# Patient Record
Sex: Female | Born: 1940 | Race: White | Hispanic: No | Smoking: Current every day smoker
Health system: Southern US, Community
[De-identification: ages and names within clinical notes are randomized; demographics above are authoritative.]

## PROBLEM LIST (undated history)

## (undated) DIAGNOSIS — J449 Chronic obstructive pulmonary disease, unspecified: Secondary | ICD-10-CM

## (undated) DIAGNOSIS — I10 Essential (primary) hypertension: Secondary | ICD-10-CM

## (undated) DIAGNOSIS — F419 Anxiety disorder, unspecified: Secondary | ICD-10-CM

## (undated) DIAGNOSIS — R7303 Prediabetes: Secondary | ICD-10-CM

## (undated) DIAGNOSIS — N200 Calculus of kidney: Secondary | ICD-10-CM

## (undated) HISTORY — PX: HERNIA REPAIR: SHX51

## (undated) HISTORY — PX: EYE SURGERY: SHX253

---

## 2011-07-10 ENCOUNTER — Other Ambulatory Visit (HOSPITAL_COMMUNITY): Payer: Self-pay | Admitting: Orthopaedic Surgery

## 2011-07-10 DIAGNOSIS — M25552 Pain in left hip: Secondary | ICD-10-CM

## 2011-07-10 DIAGNOSIS — M545 Low back pain: Secondary | ICD-10-CM

## 2011-07-22 ENCOUNTER — Ambulatory Visit (HOSPITAL_COMMUNITY): Payer: Self-pay

## 2011-07-22 ENCOUNTER — Encounter (HOSPITAL_COMMUNITY): Payer: Self-pay

## 2014-04-25 DIAGNOSIS — M8580 Other specified disorders of bone density and structure, unspecified site: Secondary | ICD-10-CM | POA: Diagnosis not present

## 2014-04-25 DIAGNOSIS — E785 Hyperlipidemia, unspecified: Secondary | ICD-10-CM | POA: Diagnosis not present

## 2014-04-25 DIAGNOSIS — Z23 Encounter for immunization: Secondary | ICD-10-CM | POA: Diagnosis not present

## 2014-04-25 DIAGNOSIS — J449 Chronic obstructive pulmonary disease, unspecified: Secondary | ICD-10-CM | POA: Diagnosis not present

## 2014-04-25 DIAGNOSIS — I10 Essential (primary) hypertension: Secondary | ICD-10-CM | POA: Diagnosis not present

## 2014-04-25 DIAGNOSIS — E1140 Type 2 diabetes mellitus with diabetic neuropathy, unspecified: Secondary | ICD-10-CM | POA: Diagnosis not present

## 2014-05-02 DIAGNOSIS — M8580 Other specified disorders of bone density and structure, unspecified site: Secondary | ICD-10-CM | POA: Diagnosis not present

## 2014-05-02 DIAGNOSIS — Z13820 Encounter for screening for osteoporosis: Secondary | ICD-10-CM | POA: Diagnosis not present

## 2014-05-02 DIAGNOSIS — M8589 Other specified disorders of bone density and structure, multiple sites: Secondary | ICD-10-CM | POA: Diagnosis not present

## 2014-05-02 DIAGNOSIS — Z1231 Encounter for screening mammogram for malignant neoplasm of breast: Secondary | ICD-10-CM | POA: Diagnosis not present

## 2014-05-08 DIAGNOSIS — J449 Chronic obstructive pulmonary disease, unspecified: Secondary | ICD-10-CM | POA: Diagnosis not present

## 2014-05-09 DIAGNOSIS — N6002 Solitary cyst of left breast: Secondary | ICD-10-CM | POA: Diagnosis not present

## 2014-05-09 DIAGNOSIS — R928 Other abnormal and inconclusive findings on diagnostic imaging of breast: Secondary | ICD-10-CM | POA: Diagnosis not present

## 2014-06-08 DIAGNOSIS — J449 Chronic obstructive pulmonary disease, unspecified: Secondary | ICD-10-CM | POA: Diagnosis not present

## 2014-06-28 DIAGNOSIS — L578 Other skin changes due to chronic exposure to nonionizing radiation: Secondary | ICD-10-CM | POA: Diagnosis not present

## 2014-06-28 DIAGNOSIS — L821 Other seborrheic keratosis: Secondary | ICD-10-CM | POA: Diagnosis not present

## 2014-06-28 DIAGNOSIS — C44529 Squamous cell carcinoma of skin of other part of trunk: Secondary | ICD-10-CM | POA: Diagnosis not present

## 2014-06-28 DIAGNOSIS — H25813 Combined forms of age-related cataract, bilateral: Secondary | ICD-10-CM | POA: Diagnosis not present

## 2014-07-05 DIAGNOSIS — C44529 Squamous cell carcinoma of skin of other part of trunk: Secondary | ICD-10-CM | POA: Diagnosis not present

## 2014-07-07 DIAGNOSIS — J449 Chronic obstructive pulmonary disease, unspecified: Secondary | ICD-10-CM | POA: Diagnosis not present

## 2014-07-27 DIAGNOSIS — Z1389 Encounter for screening for other disorder: Secondary | ICD-10-CM | POA: Diagnosis not present

## 2014-07-27 DIAGNOSIS — E785 Hyperlipidemia, unspecified: Secondary | ICD-10-CM | POA: Diagnosis not present

## 2014-07-27 DIAGNOSIS — Z9181 History of falling: Secondary | ICD-10-CM | POA: Diagnosis not present

## 2014-07-27 DIAGNOSIS — J449 Chronic obstructive pulmonary disease, unspecified: Secondary | ICD-10-CM | POA: Diagnosis not present

## 2014-07-27 DIAGNOSIS — E559 Vitamin D deficiency, unspecified: Secondary | ICD-10-CM | POA: Diagnosis not present

## 2014-07-27 DIAGNOSIS — I10 Essential (primary) hypertension: Secondary | ICD-10-CM | POA: Diagnosis not present

## 2014-07-27 DIAGNOSIS — E1142 Type 2 diabetes mellitus with diabetic polyneuropathy: Secondary | ICD-10-CM | POA: Diagnosis not present

## 2014-07-27 DIAGNOSIS — J302 Other seasonal allergic rhinitis: Secondary | ICD-10-CM | POA: Diagnosis not present

## 2014-08-07 DIAGNOSIS — J449 Chronic obstructive pulmonary disease, unspecified: Secondary | ICD-10-CM | POA: Diagnosis not present

## 2014-08-11 DIAGNOSIS — Z6832 Body mass index (BMI) 32.0-32.9, adult: Secondary | ICD-10-CM | POA: Diagnosis not present

## 2014-08-11 DIAGNOSIS — M1611 Unilateral primary osteoarthritis, right hip: Secondary | ICD-10-CM | POA: Diagnosis not present

## 2014-08-11 DIAGNOSIS — F419 Anxiety disorder, unspecified: Secondary | ICD-10-CM | POA: Diagnosis not present

## 2014-08-11 DIAGNOSIS — M5126 Other intervertebral disc displacement, lumbar region: Secondary | ICD-10-CM | POA: Diagnosis not present

## 2014-08-12 DIAGNOSIS — R269 Unspecified abnormalities of gait and mobility: Secondary | ICD-10-CM | POA: Diagnosis not present

## 2014-08-12 DIAGNOSIS — F17210 Nicotine dependence, cigarettes, uncomplicated: Secondary | ICD-10-CM | POA: Diagnosis not present

## 2014-08-12 DIAGNOSIS — Z79899 Other long term (current) drug therapy: Secondary | ICD-10-CM | POA: Diagnosis not present

## 2014-08-12 DIAGNOSIS — R51 Headache: Secondary | ICD-10-CM | POA: Diagnosis not present

## 2014-08-12 DIAGNOSIS — Z7952 Long term (current) use of systemic steroids: Secondary | ICD-10-CM | POA: Diagnosis not present

## 2014-08-12 DIAGNOSIS — M25551 Pain in right hip: Secondary | ICD-10-CM | POA: Diagnosis not present

## 2014-08-12 DIAGNOSIS — M79662 Pain in left lower leg: Secondary | ICD-10-CM | POA: Diagnosis not present

## 2014-08-12 DIAGNOSIS — M545 Low back pain: Secondary | ICD-10-CM | POA: Diagnosis not present

## 2014-08-12 DIAGNOSIS — S199XXA Unspecified injury of neck, initial encounter: Secondary | ICD-10-CM | POA: Diagnosis not present

## 2014-08-12 DIAGNOSIS — R404 Transient alteration of awareness: Secondary | ICD-10-CM | POA: Diagnosis not present

## 2014-08-12 DIAGNOSIS — Z9181 History of falling: Secondary | ICD-10-CM | POA: Diagnosis not present

## 2014-08-12 DIAGNOSIS — M542 Cervicalgia: Secondary | ICD-10-CM | POA: Diagnosis not present

## 2014-08-12 DIAGNOSIS — S299XXA Unspecified injury of thorax, initial encounter: Secondary | ICD-10-CM | POA: Diagnosis not present

## 2014-08-12 DIAGNOSIS — R102 Pelvic and perineal pain: Secondary | ICD-10-CM | POA: Diagnosis not present

## 2014-08-12 DIAGNOSIS — M79661 Pain in right lower leg: Secondary | ICD-10-CM | POA: Diagnosis not present

## 2014-08-12 DIAGNOSIS — M1611 Unilateral primary osteoarthritis, right hip: Secondary | ICD-10-CM | POA: Diagnosis not present

## 2014-08-12 DIAGNOSIS — M79605 Pain in left leg: Secondary | ICD-10-CM | POA: Diagnosis not present

## 2014-08-12 DIAGNOSIS — R531 Weakness: Secondary | ICD-10-CM | POA: Diagnosis not present

## 2014-08-12 DIAGNOSIS — M79604 Pain in right leg: Secondary | ICD-10-CM | POA: Diagnosis not present

## 2014-08-12 DIAGNOSIS — I10 Essential (primary) hypertension: Secondary | ICD-10-CM | POA: Diagnosis not present

## 2014-08-12 DIAGNOSIS — M546 Pain in thoracic spine: Secondary | ICD-10-CM | POA: Diagnosis not present

## 2014-08-14 DIAGNOSIS — E119 Type 2 diabetes mellitus without complications: Secondary | ICD-10-CM | POA: Diagnosis not present

## 2014-08-14 DIAGNOSIS — M545 Low back pain: Secondary | ICD-10-CM | POA: Diagnosis not present

## 2014-08-14 DIAGNOSIS — M6281 Muscle weakness (generalized): Secondary | ICD-10-CM | POA: Diagnosis not present

## 2014-08-14 DIAGNOSIS — R296 Repeated falls: Secondary | ICD-10-CM | POA: Diagnosis not present

## 2014-08-16 DIAGNOSIS — M545 Low back pain: Secondary | ICD-10-CM | POA: Diagnosis not present

## 2014-08-16 DIAGNOSIS — M6281 Muscle weakness (generalized): Secondary | ICD-10-CM | POA: Diagnosis not present

## 2014-08-16 DIAGNOSIS — E119 Type 2 diabetes mellitus without complications: Secondary | ICD-10-CM | POA: Diagnosis not present

## 2014-08-16 DIAGNOSIS — R296 Repeated falls: Secondary | ICD-10-CM | POA: Diagnosis not present

## 2014-08-21 DIAGNOSIS — M545 Low back pain: Secondary | ICD-10-CM | POA: Diagnosis not present

## 2014-08-21 DIAGNOSIS — M6281 Muscle weakness (generalized): Secondary | ICD-10-CM | POA: Diagnosis not present

## 2014-08-21 DIAGNOSIS — R296 Repeated falls: Secondary | ICD-10-CM | POA: Diagnosis not present

## 2014-08-21 DIAGNOSIS — E119 Type 2 diabetes mellitus without complications: Secondary | ICD-10-CM | POA: Diagnosis not present

## 2014-08-22 DIAGNOSIS — E119 Type 2 diabetes mellitus without complications: Secondary | ICD-10-CM | POA: Diagnosis not present

## 2014-08-22 DIAGNOSIS — R296 Repeated falls: Secondary | ICD-10-CM | POA: Diagnosis not present

## 2014-08-22 DIAGNOSIS — M545 Low back pain: Secondary | ICD-10-CM | POA: Diagnosis not present

## 2014-08-22 DIAGNOSIS — M6281 Muscle weakness (generalized): Secondary | ICD-10-CM | POA: Diagnosis not present

## 2014-08-23 DIAGNOSIS — M545 Low back pain: Secondary | ICD-10-CM | POA: Diagnosis not present

## 2014-08-24 DIAGNOSIS — M6281 Muscle weakness (generalized): Secondary | ICD-10-CM | POA: Diagnosis not present

## 2014-08-24 DIAGNOSIS — R296 Repeated falls: Secondary | ICD-10-CM | POA: Diagnosis not present

## 2014-08-24 DIAGNOSIS — M545 Low back pain: Secondary | ICD-10-CM | POA: Diagnosis not present

## 2014-08-24 DIAGNOSIS — E119 Type 2 diabetes mellitus without complications: Secondary | ICD-10-CM | POA: Diagnosis not present

## 2014-08-26 DIAGNOSIS — R296 Repeated falls: Secondary | ICD-10-CM | POA: Diagnosis not present

## 2014-08-26 DIAGNOSIS — M545 Low back pain: Secondary | ICD-10-CM | POA: Diagnosis not present

## 2014-08-26 DIAGNOSIS — E119 Type 2 diabetes mellitus without complications: Secondary | ICD-10-CM | POA: Diagnosis not present

## 2014-08-26 DIAGNOSIS — M6281 Muscle weakness (generalized): Secondary | ICD-10-CM | POA: Diagnosis not present

## 2014-08-29 DIAGNOSIS — M545 Low back pain: Secondary | ICD-10-CM | POA: Diagnosis not present

## 2014-08-29 DIAGNOSIS — R296 Repeated falls: Secondary | ICD-10-CM | POA: Diagnosis not present

## 2014-08-29 DIAGNOSIS — M6281 Muscle weakness (generalized): Secondary | ICD-10-CM | POA: Diagnosis not present

## 2014-08-29 DIAGNOSIS — E119 Type 2 diabetes mellitus without complications: Secondary | ICD-10-CM | POA: Diagnosis not present

## 2014-09-01 DIAGNOSIS — M545 Low back pain: Secondary | ICD-10-CM | POA: Diagnosis not present

## 2014-09-01 DIAGNOSIS — R296 Repeated falls: Secondary | ICD-10-CM | POA: Diagnosis not present

## 2014-09-01 DIAGNOSIS — M6281 Muscle weakness (generalized): Secondary | ICD-10-CM | POA: Diagnosis not present

## 2014-09-01 DIAGNOSIS — E119 Type 2 diabetes mellitus without complications: Secondary | ICD-10-CM | POA: Diagnosis not present

## 2014-09-06 DIAGNOSIS — J449 Chronic obstructive pulmonary disease, unspecified: Secondary | ICD-10-CM | POA: Diagnosis not present

## 2014-09-07 DIAGNOSIS — E119 Type 2 diabetes mellitus without complications: Secondary | ICD-10-CM | POA: Diagnosis not present

## 2014-09-07 DIAGNOSIS — M6281 Muscle weakness (generalized): Secondary | ICD-10-CM | POA: Diagnosis not present

## 2014-09-07 DIAGNOSIS — R296 Repeated falls: Secondary | ICD-10-CM | POA: Diagnosis not present

## 2014-09-07 DIAGNOSIS — M545 Low back pain: Secondary | ICD-10-CM | POA: Diagnosis not present

## 2014-09-08 DIAGNOSIS — M545 Low back pain: Secondary | ICD-10-CM | POA: Diagnosis not present

## 2014-09-08 DIAGNOSIS — R296 Repeated falls: Secondary | ICD-10-CM | POA: Diagnosis not present

## 2014-09-08 DIAGNOSIS — M6281 Muscle weakness (generalized): Secondary | ICD-10-CM | POA: Diagnosis not present

## 2014-09-08 DIAGNOSIS — E119 Type 2 diabetes mellitus without complications: Secondary | ICD-10-CM | POA: Diagnosis not present

## 2014-09-20 DIAGNOSIS — M6281 Muscle weakness (generalized): Secondary | ICD-10-CM | POA: Diagnosis not present

## 2014-09-29 DIAGNOSIS — R531 Weakness: Secondary | ICD-10-CM | POA: Diagnosis not present

## 2014-09-30 DIAGNOSIS — G6181 Chronic inflammatory demyelinating polyneuritis: Secondary | ICD-10-CM | POA: Diagnosis not present

## 2014-09-30 DIAGNOSIS — M5417 Radiculopathy, lumbosacral region: Secondary | ICD-10-CM | POA: Diagnosis not present

## 2014-09-30 DIAGNOSIS — M6281 Muscle weakness (generalized): Secondary | ICD-10-CM | POA: Diagnosis not present

## 2014-09-30 DIAGNOSIS — G5731 Lesion of lateral popliteal nerve, right lower limb: Secondary | ICD-10-CM | POA: Diagnosis not present

## 2014-10-05 DIAGNOSIS — M5126 Other intervertebral disc displacement, lumbar region: Secondary | ICD-10-CM | POA: Diagnosis not present

## 2014-10-05 DIAGNOSIS — M47812 Spondylosis without myelopathy or radiculopathy, cervical region: Secondary | ICD-10-CM | POA: Diagnosis not present

## 2014-10-05 DIAGNOSIS — M4316 Spondylolisthesis, lumbar region: Secondary | ICD-10-CM | POA: Diagnosis not present

## 2014-10-05 DIAGNOSIS — G6181 Chronic inflammatory demyelinating polyneuritis: Secondary | ICD-10-CM | POA: Diagnosis not present

## 2014-10-05 DIAGNOSIS — M5136 Other intervertebral disc degeneration, lumbar region: Secondary | ICD-10-CM | POA: Diagnosis not present

## 2014-10-05 DIAGNOSIS — M9973 Connective tissue and disc stenosis of intervertebral foramina of lumbar region: Secondary | ICD-10-CM | POA: Diagnosis not present

## 2014-10-07 DIAGNOSIS — J449 Chronic obstructive pulmonary disease, unspecified: Secondary | ICD-10-CM | POA: Diagnosis not present

## 2014-10-10 DIAGNOSIS — N201 Calculus of ureter: Secondary | ICD-10-CM | POA: Diagnosis not present

## 2014-10-10 DIAGNOSIS — N3090 Cystitis, unspecified without hematuria: Secondary | ICD-10-CM | POA: Diagnosis not present

## 2014-10-10 DIAGNOSIS — N1330 Unspecified hydronephrosis: Secondary | ICD-10-CM | POA: Diagnosis not present

## 2014-10-13 DIAGNOSIS — I10 Essential (primary) hypertension: Secondary | ICD-10-CM | POA: Diagnosis not present

## 2014-10-13 DIAGNOSIS — E119 Type 2 diabetes mellitus without complications: Secondary | ICD-10-CM | POA: Diagnosis not present

## 2014-10-13 DIAGNOSIS — N132 Hydronephrosis with renal and ureteral calculous obstruction: Secondary | ICD-10-CM | POA: Diagnosis not present

## 2014-10-13 DIAGNOSIS — F17200 Nicotine dependence, unspecified, uncomplicated: Secondary | ICD-10-CM | POA: Diagnosis not present

## 2014-10-13 DIAGNOSIS — J449 Chronic obstructive pulmonary disease, unspecified: Secondary | ICD-10-CM | POA: Diagnosis not present

## 2014-10-13 DIAGNOSIS — N209 Urinary calculus, unspecified: Secondary | ICD-10-CM | POA: Diagnosis not present

## 2014-10-13 DIAGNOSIS — N201 Calculus of ureter: Secondary | ICD-10-CM | POA: Diagnosis not present

## 2014-10-20 DIAGNOSIS — G6181 Chronic inflammatory demyelinating polyneuritis: Secondary | ICD-10-CM | POA: Diagnosis not present

## 2014-10-20 DIAGNOSIS — N201 Calculus of ureter: Secondary | ICD-10-CM | POA: Diagnosis not present

## 2014-10-20 DIAGNOSIS — N1330 Unspecified hydronephrosis: Secondary | ICD-10-CM | POA: Diagnosis not present

## 2014-10-20 DIAGNOSIS — M5417 Radiculopathy, lumbosacral region: Secondary | ICD-10-CM | POA: Diagnosis not present

## 2014-10-20 DIAGNOSIS — G5731 Lesion of lateral popliteal nerve, right lower limb: Secondary | ICD-10-CM | POA: Diagnosis not present

## 2014-10-20 DIAGNOSIS — M6281 Muscle weakness (generalized): Secondary | ICD-10-CM | POA: Diagnosis not present

## 2014-10-20 DIAGNOSIS — N3090 Cystitis, unspecified without hematuria: Secondary | ICD-10-CM | POA: Diagnosis not present

## 2014-10-27 DIAGNOSIS — M6281 Muscle weakness (generalized): Secondary | ICD-10-CM | POA: Diagnosis not present

## 2014-10-27 DIAGNOSIS — R836 Abnormal cytological findings in cerebrospinal fluid: Secondary | ICD-10-CM | POA: Diagnosis not present

## 2014-10-27 DIAGNOSIS — G6181 Chronic inflammatory demyelinating polyneuritis: Secondary | ICD-10-CM | POA: Diagnosis not present

## 2014-11-06 DIAGNOSIS — J449 Chronic obstructive pulmonary disease, unspecified: Secondary | ICD-10-CM | POA: Diagnosis not present

## 2014-11-08 DIAGNOSIS — N3090 Cystitis, unspecified without hematuria: Secondary | ICD-10-CM | POA: Diagnosis not present

## 2014-11-08 DIAGNOSIS — N201 Calculus of ureter: Secondary | ICD-10-CM | POA: Diagnosis not present

## 2014-11-08 DIAGNOSIS — N1330 Unspecified hydronephrosis: Secondary | ICD-10-CM | POA: Diagnosis not present

## 2014-11-15 DIAGNOSIS — C44529 Squamous cell carcinoma of skin of other part of trunk: Secondary | ICD-10-CM | POA: Diagnosis not present

## 2014-11-15 DIAGNOSIS — L821 Other seborrheic keratosis: Secondary | ICD-10-CM | POA: Diagnosis not present

## 2014-11-15 DIAGNOSIS — C44729 Squamous cell carcinoma of skin of left lower limb, including hip: Secondary | ICD-10-CM | POA: Diagnosis not present

## 2014-11-21 DIAGNOSIS — M6281 Muscle weakness (generalized): Secondary | ICD-10-CM | POA: Diagnosis not present

## 2014-11-21 DIAGNOSIS — M5417 Radiculopathy, lumbosacral region: Secondary | ICD-10-CM | POA: Diagnosis not present

## 2014-11-21 DIAGNOSIS — G3184 Mild cognitive impairment, so stated: Secondary | ICD-10-CM | POA: Diagnosis not present

## 2014-12-01 DIAGNOSIS — M4727 Other spondylosis with radiculopathy, lumbosacral region: Secondary | ICD-10-CM | POA: Diagnosis not present

## 2014-12-01 DIAGNOSIS — M5417 Radiculopathy, lumbosacral region: Secondary | ICD-10-CM | POA: Diagnosis not present

## 2014-12-01 DIAGNOSIS — M6281 Muscle weakness (generalized): Secondary | ICD-10-CM | POA: Diagnosis not present

## 2014-12-01 DIAGNOSIS — M545 Low back pain: Secondary | ICD-10-CM | POA: Diagnosis not present

## 2014-12-07 DIAGNOSIS — J449 Chronic obstructive pulmonary disease, unspecified: Secondary | ICD-10-CM | POA: Diagnosis not present

## 2014-12-13 DIAGNOSIS — N3090 Cystitis, unspecified without hematuria: Secondary | ICD-10-CM | POA: Diagnosis not present

## 2014-12-13 DIAGNOSIS — N1330 Unspecified hydronephrosis: Secondary | ICD-10-CM | POA: Diagnosis not present

## 2014-12-13 DIAGNOSIS — N201 Calculus of ureter: Secondary | ICD-10-CM | POA: Diagnosis not present

## 2015-01-07 DIAGNOSIS — J449 Chronic obstructive pulmonary disease, unspecified: Secondary | ICD-10-CM | POA: Diagnosis not present

## 2015-01-30 DIAGNOSIS — Z1389 Encounter for screening for other disorder: Secondary | ICD-10-CM | POA: Diagnosis not present

## 2015-01-30 DIAGNOSIS — J449 Chronic obstructive pulmonary disease, unspecified: Secondary | ICD-10-CM | POA: Diagnosis not present

## 2015-01-30 DIAGNOSIS — E1142 Type 2 diabetes mellitus with diabetic polyneuropathy: Secondary | ICD-10-CM | POA: Diagnosis not present

## 2015-01-30 DIAGNOSIS — E559 Vitamin D deficiency, unspecified: Secondary | ICD-10-CM | POA: Diagnosis not present

## 2015-01-30 DIAGNOSIS — Z23 Encounter for immunization: Secondary | ICD-10-CM | POA: Diagnosis not present

## 2015-01-30 DIAGNOSIS — I10 Essential (primary) hypertension: Secondary | ICD-10-CM | POA: Diagnosis not present

## 2015-01-30 DIAGNOSIS — E785 Hyperlipidemia, unspecified: Secondary | ICD-10-CM | POA: Diagnosis not present

## 2015-01-30 DIAGNOSIS — Z139 Encounter for screening, unspecified: Secondary | ICD-10-CM | POA: Diagnosis not present

## 2015-01-30 DIAGNOSIS — Z9181 History of falling: Secondary | ICD-10-CM | POA: Diagnosis not present

## 2015-01-30 DIAGNOSIS — F0390 Unspecified dementia without behavioral disturbance: Secondary | ICD-10-CM | POA: Diagnosis not present

## 2015-02-06 DIAGNOSIS — J449 Chronic obstructive pulmonary disease, unspecified: Secondary | ICD-10-CM | POA: Diagnosis not present

## 2015-02-28 DIAGNOSIS — B356 Tinea cruris: Secondary | ICD-10-CM | POA: Diagnosis not present

## 2015-02-28 DIAGNOSIS — J440 Chronic obstructive pulmonary disease with acute lower respiratory infection: Secondary | ICD-10-CM | POA: Diagnosis not present

## 2015-02-28 DIAGNOSIS — F419 Anxiety disorder, unspecified: Secondary | ICD-10-CM | POA: Diagnosis not present

## 2015-03-08 DIAGNOSIS — H43393 Other vitreous opacities, bilateral: Secondary | ICD-10-CM | POA: Diagnosis not present

## 2015-03-08 DIAGNOSIS — E119 Type 2 diabetes mellitus without complications: Secondary | ICD-10-CM | POA: Diagnosis not present

## 2015-03-09 DIAGNOSIS — J449 Chronic obstructive pulmonary disease, unspecified: Secondary | ICD-10-CM | POA: Diagnosis not present

## 2015-04-08 DIAGNOSIS — J449 Chronic obstructive pulmonary disease, unspecified: Secondary | ICD-10-CM | POA: Diagnosis not present

## 2015-04-23 DIAGNOSIS — N200 Calculus of kidney: Secondary | ICD-10-CM

## 2015-04-23 HISTORY — DX: Calculus of kidney: N20.0

## 2015-05-09 DIAGNOSIS — J449 Chronic obstructive pulmonary disease, unspecified: Secondary | ICD-10-CM | POA: Diagnosis not present

## 2015-06-09 DIAGNOSIS — J449 Chronic obstructive pulmonary disease, unspecified: Secondary | ICD-10-CM | POA: Diagnosis not present

## 2015-07-07 DIAGNOSIS — J449 Chronic obstructive pulmonary disease, unspecified: Secondary | ICD-10-CM | POA: Diagnosis not present

## 2015-07-18 DIAGNOSIS — Z1231 Encounter for screening mammogram for malignant neoplasm of breast: Secondary | ICD-10-CM | POA: Diagnosis not present

## 2015-08-07 DIAGNOSIS — J449 Chronic obstructive pulmonary disease, unspecified: Secondary | ICD-10-CM | POA: Diagnosis not present

## 2015-08-14 DIAGNOSIS — H40013 Open angle with borderline findings, low risk, bilateral: Secondary | ICD-10-CM | POA: Diagnosis not present

## 2015-08-22 DIAGNOSIS — E785 Hyperlipidemia, unspecified: Secondary | ICD-10-CM | POA: Diagnosis not present

## 2015-08-22 DIAGNOSIS — R233 Spontaneous ecchymoses: Secondary | ICD-10-CM | POA: Diagnosis not present

## 2015-08-22 DIAGNOSIS — Z23 Encounter for immunization: Secondary | ICD-10-CM | POA: Diagnosis not present

## 2015-08-22 DIAGNOSIS — E1142 Type 2 diabetes mellitus with diabetic polyneuropathy: Secondary | ICD-10-CM | POA: Diagnosis not present

## 2015-08-22 DIAGNOSIS — E559 Vitamin D deficiency, unspecified: Secondary | ICD-10-CM | POA: Diagnosis not present

## 2015-08-22 DIAGNOSIS — I10 Essential (primary) hypertension: Secondary | ICD-10-CM | POA: Diagnosis not present

## 2015-08-22 DIAGNOSIS — J449 Chronic obstructive pulmonary disease, unspecified: Secondary | ICD-10-CM | POA: Diagnosis not present

## 2015-08-23 DIAGNOSIS — R413 Other amnesia: Secondary | ICD-10-CM | POA: Diagnosis not present

## 2015-08-23 DIAGNOSIS — M5417 Radiculopathy, lumbosacral region: Secondary | ICD-10-CM | POA: Diagnosis not present

## 2015-08-26 DIAGNOSIS — R413 Other amnesia: Secondary | ICD-10-CM | POA: Diagnosis not present

## 2015-09-01 DIAGNOSIS — R079 Chest pain, unspecified: Secondary | ICD-10-CM | POA: Diagnosis not present

## 2015-09-01 DIAGNOSIS — Z9981 Dependence on supplemental oxygen: Secondary | ICD-10-CM | POA: Diagnosis not present

## 2015-09-01 DIAGNOSIS — E119 Type 2 diabetes mellitus without complications: Secondary | ICD-10-CM | POA: Diagnosis not present

## 2015-09-01 DIAGNOSIS — I201 Angina pectoris with documented spasm: Secondary | ICD-10-CM | POA: Diagnosis not present

## 2015-09-01 DIAGNOSIS — I160 Hypertensive urgency: Secondary | ICD-10-CM | POA: Diagnosis not present

## 2015-09-01 DIAGNOSIS — G8929 Other chronic pain: Secondary | ICD-10-CM | POA: Diagnosis not present

## 2015-09-01 DIAGNOSIS — Z7982 Long term (current) use of aspirin: Secondary | ICD-10-CM | POA: Diagnosis not present

## 2015-09-01 DIAGNOSIS — J449 Chronic obstructive pulmonary disease, unspecified: Secondary | ICD-10-CM | POA: Diagnosis not present

## 2015-09-01 DIAGNOSIS — I209 Angina pectoris, unspecified: Secondary | ICD-10-CM | POA: Diagnosis not present

## 2015-09-01 DIAGNOSIS — E785 Hyperlipidemia, unspecified: Secondary | ICD-10-CM | POA: Diagnosis not present

## 2015-09-01 DIAGNOSIS — Z79899 Other long term (current) drug therapy: Secondary | ICD-10-CM | POA: Diagnosis not present

## 2015-09-01 DIAGNOSIS — J9610 Chronic respiratory failure, unspecified whether with hypoxia or hypercapnia: Secondary | ICD-10-CM | POA: Diagnosis not present

## 2015-09-02 DIAGNOSIS — R079 Chest pain, unspecified: Secondary | ICD-10-CM | POA: Diagnosis not present

## 2015-09-06 DIAGNOSIS — J449 Chronic obstructive pulmonary disease, unspecified: Secondary | ICD-10-CM | POA: Diagnosis not present

## 2015-09-06 DIAGNOSIS — R079 Chest pain, unspecified: Secondary | ICD-10-CM | POA: Diagnosis not present

## 2015-09-06 DIAGNOSIS — I160 Hypertensive urgency: Secondary | ICD-10-CM | POA: Diagnosis not present

## 2015-09-08 DIAGNOSIS — R413 Other amnesia: Secondary | ICD-10-CM | POA: Diagnosis not present

## 2015-09-08 DIAGNOSIS — M5417 Radiculopathy, lumbosacral region: Secondary | ICD-10-CM | POA: Diagnosis not present

## 2015-09-08 DIAGNOSIS — G4700 Insomnia, unspecified: Secondary | ICD-10-CM | POA: Diagnosis not present

## 2015-09-08 DIAGNOSIS — I671 Cerebral aneurysm, nonruptured: Secondary | ICD-10-CM | POA: Diagnosis not present

## 2015-09-26 DIAGNOSIS — I10 Essential (primary) hypertension: Secondary | ICD-10-CM | POA: Diagnosis not present

## 2015-10-07 DIAGNOSIS — J449 Chronic obstructive pulmonary disease, unspecified: Secondary | ICD-10-CM | POA: Diagnosis not present

## 2015-10-11 DIAGNOSIS — H401132 Primary open-angle glaucoma, bilateral, moderate stage: Secondary | ICD-10-CM | POA: Diagnosis not present

## 2015-10-11 DIAGNOSIS — I671 Cerebral aneurysm, nonruptured: Secondary | ICD-10-CM | POA: Diagnosis not present

## 2015-10-31 DIAGNOSIS — I671 Cerebral aneurysm, nonruptured: Secondary | ICD-10-CM | POA: Diagnosis not present

## 2015-11-06 DIAGNOSIS — J449 Chronic obstructive pulmonary disease, unspecified: Secondary | ICD-10-CM | POA: Diagnosis not present

## 2015-11-28 ENCOUNTER — Telehealth (HOSPITAL_COMMUNITY): Payer: Self-pay

## 2015-11-28 NOTE — Telephone Encounter (Signed)
Called to schedule, left message for pt to return call. AW 

## 2015-12-07 ENCOUNTER — Other Ambulatory Visit (HOSPITAL_COMMUNITY): Payer: Self-pay | Admitting: Interventional Radiology

## 2015-12-07 ENCOUNTER — Telehealth (HOSPITAL_COMMUNITY): Payer: Self-pay

## 2015-12-07 DIAGNOSIS — I729 Aneurysm of unspecified site: Secondary | ICD-10-CM

## 2015-12-07 DIAGNOSIS — J449 Chronic obstructive pulmonary disease, unspecified: Secondary | ICD-10-CM | POA: Diagnosis not present

## 2015-12-14 DIAGNOSIS — H401131 Primary open-angle glaucoma, bilateral, mild stage: Secondary | ICD-10-CM | POA: Diagnosis not present

## 2015-12-19 ENCOUNTER — Ambulatory Visit (HOSPITAL_COMMUNITY): Admission: RE | Admit: 2015-12-19 | Payer: Self-pay | Source: Ambulatory Visit

## 2015-12-19 ENCOUNTER — Ambulatory Visit (HOSPITAL_COMMUNITY)
Admission: RE | Admit: 2015-12-19 | Discharge: 2015-12-19 | Disposition: A | Discharge status: Home / Self Care | Payer: Medicare Other | Source: Ambulatory Visit | Attending: Interventional Radiology | Admitting: Interventional Radiology

## 2015-12-19 ENCOUNTER — Other Ambulatory Visit (HOSPITAL_COMMUNITY): Payer: Self-pay

## 2015-12-19 ENCOUNTER — Other Ambulatory Visit (HOSPITAL_COMMUNITY): Payer: Self-pay | Admitting: Interventional Radiology

## 2015-12-19 DIAGNOSIS — I671 Cerebral aneurysm, nonruptured: Secondary | ICD-10-CM

## 2015-12-19 DIAGNOSIS — I259 Chronic ischemic heart disease, unspecified: Secondary | ICD-10-CM | POA: Diagnosis not present

## 2015-12-19 DIAGNOSIS — I6789 Other cerebrovascular disease: Secondary | ICD-10-CM | POA: Diagnosis not present

## 2015-12-19 HISTORY — PX: IR GENERIC HISTORICAL: IMG1180011

## 2015-12-21 ENCOUNTER — Encounter (HOSPITAL_COMMUNITY): Payer: Self-pay | Admitting: Interventional Radiology

## 2015-12-26 ENCOUNTER — Other Ambulatory Visit (HOSPITAL_COMMUNITY): Payer: Self-pay | Admitting: Interventional Radiology

## 2015-12-26 DIAGNOSIS — I671 Cerebral aneurysm, nonruptured: Secondary | ICD-10-CM

## 2016-01-01 ENCOUNTER — Other Ambulatory Visit: Payer: Self-pay | Admitting: Physician Assistant

## 2016-01-01 ENCOUNTER — Other Ambulatory Visit: Payer: Self-pay | Admitting: General Surgery

## 2016-01-02 ENCOUNTER — Encounter (HOSPITAL_COMMUNITY): Payer: Self-pay

## 2016-01-02 ENCOUNTER — Other Ambulatory Visit (HOSPITAL_COMMUNITY): Payer: Self-pay | Admitting: Interventional Radiology

## 2016-01-02 ENCOUNTER — Ambulatory Visit (HOSPITAL_COMMUNITY)
Admission: RE | Admit: 2016-01-02 | Discharge: 2016-01-02 | Disposition: A | Discharge status: Home / Self Care | Payer: Medicare Other | Source: Ambulatory Visit | Attending: Interventional Radiology | Admitting: Interventional Radiology

## 2016-01-02 DIAGNOSIS — F419 Anxiety disorder, unspecified: Secondary | ICD-10-CM | POA: Insufficient documentation

## 2016-01-02 DIAGNOSIS — Z801 Family history of malignant neoplasm of trachea, bronchus and lung: Secondary | ICD-10-CM | POA: Insufficient documentation

## 2016-01-02 DIAGNOSIS — J449 Chronic obstructive pulmonary disease, unspecified: Secondary | ICD-10-CM | POA: Insufficient documentation

## 2016-01-02 DIAGNOSIS — I6501 Occlusion and stenosis of right vertebral artery: Secondary | ICD-10-CM | POA: Insufficient documentation

## 2016-01-02 DIAGNOSIS — Z87442 Personal history of urinary calculi: Secondary | ICD-10-CM | POA: Diagnosis not present

## 2016-01-02 DIAGNOSIS — Z7982 Long term (current) use of aspirin: Secondary | ICD-10-CM | POA: Insufficient documentation

## 2016-01-02 DIAGNOSIS — I671 Cerebral aneurysm, nonruptured: Secondary | ICD-10-CM | POA: Insufficient documentation

## 2016-01-02 DIAGNOSIS — R51 Headache: Secondary | ICD-10-CM | POA: Diagnosis not present

## 2016-01-02 DIAGNOSIS — R7303 Prediabetes: Secondary | ICD-10-CM | POA: Insufficient documentation

## 2016-01-02 DIAGNOSIS — F17210 Nicotine dependence, cigarettes, uncomplicated: Secondary | ICD-10-CM | POA: Insufficient documentation

## 2016-01-02 DIAGNOSIS — I720 Aneurysm of carotid artery: Secondary | ICD-10-CM | POA: Diagnosis not present

## 2016-01-02 HISTORY — DX: Prediabetes: R73.03

## 2016-01-02 HISTORY — DX: Anxiety disorder, unspecified: F41.9

## 2016-01-02 HISTORY — DX: Calculus of kidney: N20.0

## 2016-01-02 HISTORY — PX: IR GENERIC HISTORICAL: IMG1180011

## 2016-01-02 HISTORY — DX: Essential (primary) hypertension: I10

## 2016-01-02 HISTORY — DX: Chronic obstructive pulmonary disease, unspecified: J44.9

## 2016-01-02 LAB — CBC
HEMATOCRIT: 42.9 % (ref 36.0–46.0)
Hemoglobin: 13.3 g/dL (ref 12.0–15.0)
MCH: 32.7 pg (ref 26.0–34.0)
MCHC: 31 g/dL (ref 30.0–36.0)
MCV: 105.4 fL — ABNORMAL HIGH (ref 78.0–100.0)
PLATELETS: 144 10*3/uL — AB (ref 150–400)
RBC: 4.07 MIL/uL (ref 3.87–5.11)
RDW: 13.2 % (ref 11.5–15.5)
WBC: 4.7 10*3/uL (ref 4.0–10.5)

## 2016-01-02 LAB — APTT: APTT: 31 s (ref 24–36)

## 2016-01-02 LAB — BASIC METABOLIC PANEL
Anion gap: 8 (ref 5–15)
BUN: 18 mg/dL (ref 6–20)
CALCIUM: 9.6 mg/dL (ref 8.9–10.3)
CO2: 32 mmol/L (ref 22–32)
Chloride: 103 mmol/L (ref 101–111)
Creatinine, Ser: 0.95 mg/dL (ref 0.44–1.00)
GFR calc Af Amer: >60 mL/min (ref >60)
GFR, EST NON AFRICAN AMERICAN: 58 mL/min — AB (ref >60)
Glucose, Bld: 115 mg/dL — ABNORMAL HIGH (ref 65–99)
POTASSIUM: 3.9 mmol/L (ref 3.5–5.1)
SODIUM: 143 mmol/L (ref 135–145)

## 2016-01-02 LAB — PROTIME-INR
INR: 0.95
Prothrombin Time: 12.7 seconds (ref 11.4–15.2)

## 2016-01-02 MED ORDER — HEPARIN SODIUM (PORCINE) 1000 UNIT/ML IJ SOLN
INTRAMUSCULAR | Status: AC | PRN
  Administered 2016-01-02: 1000 [IU] via INTRAVENOUS

## 2016-01-02 MED ORDER — SODIUM CHLORIDE 0.9 % IV SOLN
INTRAVENOUS | Status: AC | PRN
  Administered 2016-01-02: 10 mL/h via INTRAVENOUS

## 2016-01-02 MED ORDER — SODIUM CHLORIDE 0.9 % IV SOLN: INTRAVENOUS | Status: AC

## 2016-01-02 MED ORDER — LIDOCAINE HCL 1 % IJ SOLN
INTRAMUSCULAR | Status: AC
Start: 2016-01-02 — End: 2016-01-02
  Filled 2016-01-02: qty 20

## 2016-01-02 MED ORDER — LIDOCAINE HCL 1 % IJ SOLN
INTRAMUSCULAR | Status: AC | PRN
  Administered 2016-01-02: 10 mL

## 2016-01-02 MED ORDER — IOPAMIDOL (ISOVUE-300) INJECTION 61%
INTRAVENOUS | Status: AC
  Administered 2016-01-02: 75 mL
  Filled 2016-01-02: qty 150

## 2016-01-02 MED ORDER — SODIUM CHLORIDE 0.9 % IV SOLN: INTRAVENOUS | Status: DC

## 2016-01-02 MED ORDER — MIDAZOLAM HCL 2 MG/2ML IJ SOLN
INTRAMUSCULAR | Status: AC | PRN
  Administered 2016-01-02: 1 mg via INTRAVENOUS

## 2016-01-02 MED ORDER — HEPARIN SODIUM (PORCINE) 1000 UNIT/ML IJ SOLN
INTRAMUSCULAR | Status: AC
  Filled 2016-01-02: qty 2

## 2016-01-02 MED ORDER — MIDAZOLAM HCL 2 MG/2ML IJ SOLN
INTRAMUSCULAR | Status: AC
  Filled 2016-01-02: qty 2

## 2016-01-02 MED ORDER — IOPAMIDOL (ISOVUE-300) INJECTION 61%
INTRAVENOUS | Status: AC
  Administered 2016-01-02: 10 mL
  Filled 2016-01-02: qty 50

## 2016-01-02 MED ORDER — FENTANYL CITRATE (PF) 100 MCG/2ML IJ SOLN
INTRAMUSCULAR | Status: AC
  Filled 2016-01-02: qty 2

## 2016-01-02 MED ORDER — FENTANYL CITRATE (PF) 100 MCG/2ML IJ SOLN
INTRAMUSCULAR | Status: AC | PRN
  Administered 2016-01-02: 25 ug via INTRAVENOUS

## 2016-01-02 NOTE — Procedures (Signed)
S/P 4 vessel cerebral arteriogram. RT CFA approach. Findings. 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

## 2016-01-02 NOTE — Discharge Instructions (Signed)
Angiogram, Care After °Refer to this sheet in the next few weeks. These instructions provide you with information about caring for yourself after your procedure. Your health care provider may also give you more specific instructions. Your treatment has been planned according to current medical practices, but problems sometimes occur. Call your health care provider if you have any problems or questions after your procedure. °WHAT TO EXPECT AFTER THE PROCEDURE °After your procedure, it is typical to have the following: °· Bruising at the catheter insertion site that usually fades within 1-2 weeks. °· Blood collecting in the tissue (hematoma) that may be painful to the touch. It should usually decrease in size and tenderness within 1-2 weeks. °HOME CARE INSTRUCTIONS °· Take medicines only as directed by your health care provider. °· You may shower 24-48 hours after the procedure or as directed by your health care provider. Remove the bandage (dressing) and gently wash the site with plain soap and water. Pat the area dry with a clean towel. Do not rub the site, because this may cause bleeding. °· Do not take baths, swim, or use a hot tub until your health care provider approves. °· Check your insertion site every day for redness, swelling, or drainage. °· Do not apply powder or lotion to the site. °· Do not lift over 10 lb (4.5 kg) for 5 days after your procedure or as directed by your health care provider. °· Ask your health care provider when it is okay to: °¨ Return to work or school. °¨ Resume usual physical activities or sports. °¨ Resume sexual activity. °· Do not drive home if you are discharged the same day as the procedure. Have someone else drive you. °· You may drive 24 hours after the procedure unless otherwise instructed by your health care provider. °· Do not operate machinery or power tools for 24 hours after the procedure or as directed by your health care provider. °· If your procedure was done as an  outpatient procedure, which means that you went home the same day as your procedure, a responsible adult should be with you for the first 24 hours after you arrive home. °· Keep all follow-up visits as directed by your health care provider. This is important. °SEEK MEDICAL CARE IF: °· You have a fever. °· You have chills. °· You have increased bleeding from the catheter insertion site. Hold pressure on the site. °SEEK IMMEDIATE MEDICAL CARE IF: °· You have unusual pain at the catheter insertion site. °· You have redness, warmth, or swelling at the catheter insertion site. °· You have drainage (other than a small amount of blood on the dressing) from the catheter insertion site. °· The catheter insertion site is bleeding, and the bleeding does not stop after 30 minutes of holding steady pressure on the site. °· The area near or just beyond the catheter insertion site becomes pale, cool, tingly, or numb. °  °This information is not intended to replace advice given to you by your health care provider. Make sure you discuss any questions you have with your health care provider. °  °Document Released: 10/25/2004 Document Revised: 04/29/2014 Document Reviewed: 09/09/2012 °Elsevier Interactive Patient Education ©2016 [employer] Inc. ° °

## 2016-01-02 NOTE — H&P (Signed)
Chief Complaint: Patient was seen in consultation today for cerebral arteriogram at the request of Dr Park Breed  Referring Physician(s): Dr Park Breed  Supervising Physician: Luanne Bras  Patient Status: Outpatient  History of Present Illness: Nicole May is a 75 y.o. female   Pt has worsening memory loss Was evaluated by Dr Wilford Grist and MRI was noted to have abnormal finding of Left Internal carotid artery aneurysm Referred to Dr Estanislado Pandy-- consultation 12/19/15 Scheduled now for diagnostic arteriogram  Past Medical History:  Diagnosis Date  . Anxiety   . COPD (chronic obstructive pulmonary disease) ([place])   . Hypertension   . Kidney stone on right side 2017  . Pre-diabetes     Past Surgical History:  Procedure Laterality Date  . EYE SURGERY    . HERNIA REPAIR    . IR GENERIC HISTORICAL  12/19/2015   IR RADIOLOGIST EVAL & MGMT 12/19/2015 [facility] RAD    Allergies: Review of patient's allergies indicates no known allergies.  Medications: Prior to Admission medications   Medication Sig Start Date End Date Taking? Authorizing Provider  albuterol (PROVENTIL HFA;VENTOLIN HFA) 108 (90 Base) MCG/ACT inhaler Inhale 2 puffs into the lungs every 4 (four) hours as needed for wheezing or shortness of breath.   Yes Historical Provider, MD  ALPRAZolam Duanne Moron) 1 MG tablet Take 1 mg by mouth 3 (three) times daily as needed for anxiety.   Yes Historical Provider, MD  aspirin 325 MG tablet Take 325 mg by mouth daily as needed for headache.   Yes Historical Provider, MD  Cholecalciferol (VITAMIN D3) 5000 units TABS Take 1 tablet by mouth daily.   Yes Historical Provider, MD  DiphenhydrAMINE HCl, Sleep, (UNISOM SLEEPGELS) 50 MG CAPS Take 1 capsule by mouth at bedtime.   Yes Historical Provider, MD  escitalopram (LEXAPRO) 20 MG tablet Take 20 mg by mouth daily.   Yes Historical Provider, MD  furosemide (LASIX) 40 MG tablet Take 40 mg by mouth daily as needed for  fluid.   Yes Historical Provider, MD  gabapentin (NEURONTIN) 300 MG capsule Take 300 mg by mouth 3 (three) times daily.   Yes Historical Provider, MD  hydrALAZINE (APRESOLINE) 25 MG tablet Take 25 mg by mouth 2 (two) times daily.   Yes Historical Provider, MD  latanoprost (XALATAN) 0.005 % ophthalmic solution Place 1 drop into both eyes at bedtime.   Yes Historical Provider, MD  lisinopril (PRINIVIL,ZESTRIL) 40 MG tablet Take 40 mg by mouth daily.   Yes Historical Provider, MD  metoprolol succinate (TOPROL-XL) 25 MG 24 hr tablet Take 25 mg by mouth every morning.   Yes Historical Provider, MD  Multiple Vitamins-Minerals (HAIR SKIN AND NAILS FORMULA PO) Take 1 tablet by mouth daily.   Yes Historical Provider, MD  Multiple Vitamins-Minerals (WOMENS MULTIVITAMIN) TABS Take 1 tablet by mouth daily.   Yes Historical Provider, MD  OXYGEN Inhale 2 L into the lungs continuous.   Yes Historical Provider, MD  rOPINIRole (REQUIP) 0.5 MG tablet Take 0.5 mg by mouth at bedtime as needed (Leg pain).   Yes Historical Provider, MD  Turmeric 500 MG CAPS Take 1 capsule by mouth daily.   Yes Historical Provider, MD  nystatin cream (MYCOSTATIN) Apply 1 application topically 2 (two) times daily as needed for dry skin.    Historical Provider, MD  ondansetron (ZOFRAN) 4 MG tablet Take 4 mg by mouth every 8 (eight) hours as needed for nausea or vomiting.    Historical Provider, MD  Family History  Problem Relation Age of Onset  . Cancer - Lung Mother   . Kidney disease Father     Social History   Social History  . Marital status: Single    Spouse name: N/A  . Number of children: N/A  . Years of education: N/A   Social History Main Topics  . Smoking status: Current Every Day Smoker    Packs/day: 1.00    Years: 15.00    Types: Cigarettes  . Smokeless tobacco: Never Used  . Alcohol use None  . Drug use: Unknown  . Sexual activity: Not Asked   Other Topics Concern  . None   Social History Narrative    . None     Review of Systems: A 12 point ROS discussed and pertinent positives are indicated in the HPI above.  All other systems are negative.  Review of Systems  Constitutional: Negative for activity change, fatigue and fever.  HENT: Negative for tinnitus and voice change.   Eyes: Negative for visual disturbance.  Respiratory: Positive for shortness of breath.   Gastrointestinal: Negative for nausea.  Neurological: Positive for weakness. Negative for dizziness, tremors, seizures, syncope, facial asymmetry, speech difficulty, light-headedness, numbness and headaches.  Psychiatric/Behavioral: Negative for behavioral problems and confusion.    Vital Signs: BP (!) 158/91   Pulse (!) 57   Temp 97.9 F (36.6 C) (Oral)   Resp 20   Ht 5\' 1"  (1.549 m)   Wt 180 lb (81.6 kg)   SpO2 93%   BMI 34.01 kg/m   Physical Exam  Constitutional: She is oriented to person, place, and time. She appears well-nourished.  HENT:  Head: Atraumatic.  Eyes: EOM are normal.  Neck: Neck supple.  Cardiovascular: Normal rate, regular rhythm and normal heart sounds.   Pulmonary/Chest: Effort normal and breath sounds normal. She has no wheezes.  Abdominal: Soft. Bowel sounds are normal. There is no tenderness.  Musculoskeletal: Normal range of motion.  Neurological: She is alert and oriented to person, place, and time.  Skin: Skin is warm and dry.  Psychiatric: She has a normal mood and affect. Her behavior is normal. Judgment and thought content normal.  Nursing note and vitals reviewed.   Mallampati Score:  MD Evaluation Airway: WNL Heart: WNL Abdomen: WNL Chest/ Lungs: WNL ASA  Classification: 3 Mallampati/Airway Score: One  Imaging: Ir Radiologist Eval & Mgmt  Result Date: 12/21/2015 EXAM: NEW PATIENT OFFICE VISIT CHIEF COMPLAINT: Headaches.  Worsening memory. 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 The patient is accompanied by her daughter-in-law and her son. The patient reports the subtle onset of the symptoms of forgetfulness and repetition over the past 6 months. This has become apparently more obvious recently. This prompted further evaluation with neuroimaging. 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 Her other symptoms are those of headaches which are usually bifrontal and may be associated with visual blurring, nausea and photophobia. These when severe are pounding. They clear with simple analgesics. There is no associated loss of consciousness, seizures, blindness, speech difficulties, motor, sensory or coordination difficulties. The patient does have mobility problems which she attributes to a long-standing right hip problem. Associated with the hip pain, which is intermittent, is the  sharp shooting pain associated with paresthesias which run down the outer aspect of her right thigh and the outer aspect of her right leg into her lateral malleolus. Intermittently this may also extend to involve her toes. The patient has been receiving nerve root injections with transient relief. The patient is ambulatory with a cane. Her limitation to ambulation is that of dyspnea related to her COPD. The patient can comfortably walk the distance of two standard rooms before she becomes breathless. She denies any pain in the thighs or behind her calves which may suggest intermittent claudication. Past medical history significant for depression, diabetes mellitus controlled with dietary restrictions, hypertension and memory loss. Past surgical history significant for tonsillectomy, tubal ligation, rotator cuff repair, hernia repair and kidney stones. She has had a recent  removal of a kidney stone via the transurethral route. This apparently was about a year and a half ago. Past Medical History: As above. Medications: Hydralazine, metoprolol, lisinopril, Lexapro, Unisom, Xanax and vitamin-D tablets. Allergies: No known allergies. Social History: The patient is widowed and lives with her granddaughter. Smokes up to 1 pack of cigarettes per day and has done so for 56 years. Denies using alcohol or illicit chemicals. Family History: Mother died of lung and brain cancer. Brother died of lung cancer. REVIEW OF SYSTEMS: Essentially negative for pathological symptomatology except for dyspnea with walking related to her COPD. She denies any chest pain, palpitations, pedal edema or paroxysmal nocturnal dyspnea. Her appetite appetite is normal. Her weight is steady. Denies any abdominal pain, hematemesis, melena, constipation or diarrhea. Denies any symptoms of dysuria, frequency or of hematuria. Does have urinary retention. Does have early morning wet cough sometimes with sputum production. Denies any hemoptysis. PHYSICAL EXAMINATION: In mild distress related to dyspnea on coughing. Otherwise, alert, awake and oriented to time, place, space. Affect normal. Neurologically grossly no lateralizing cranial nerve, motor or sensory difficulties. The patient is able to walk independently with slightly wide-based gait. ASSESSMENT AND PLAN: The patient's recent MRI MRA of the brain and CT angiogram of the brain were reviewed. These reveal chronic mild to moderate small vessel tight ischemic changes supratentorially. Also demonstrated is a 5 x 7 x 8 mm wide neck saccular outpouching involving the distal cavernous caval segment. This mostly likely represents an aneurysm. The natural history of intracranial and extracranial aneurysm was reviewed with the patient and the patient's family. It was felt prior to further considerations, a formal catheter angiogram would be appropriate to delineate the exact  angio architecture of this aneurysm. Treatment management considerations would then be considered depending on the morphology and the location of the aneurysm. Options also reviewed were those of continued neuroimaging surveillance initially every 6 months for a year and then yearly. The patient would like to proceed with a diagnostic catheter angiogram. The procedure, the risks and benefits were all reviewed in detail. This was scheduled at the earliest possible. Risks of ischemic stroke related to catheter angiogram of 1 in 1,000 for a patient with diabetes, high blood pressure, smoking history and age greater than 87 was reviewed. The family and the patient would like proceed with a diagnostic catheter angiogram. This will be scheduled at the earliest possible. In the meantime, the patient has strongly been advised to stop smoking and to ambulate to improve her stamina. Additionally she was advised to have her most recent cardiology consultation report sent to [place] as quickly as possible. Questions were answered to their satisfaction. The patient, her granddaughter and son leave  with good understanding and agreement with the above management plan. Electronically Signed   By: Luanne Bras M.D.   On: 12/21/2015 13:43    Labs:  CBC:  Recent Labs  01/02/16 0642  WBC 4.7  HGB 13.3  HCT 42.9  PLT 144*    COAGS:  Recent Labs  01/02/16 0642  INR 0.95  APTT 31    BMP:  Recent Labs  01/02/16 0642  NA 143  K 3.9  CL 103  CO2 32  GLUCOSE 115*  BUN 18  CALCIUM 9.6  CREATININE 0.95  GFRNONAA 58*  GFRAA >60    LIVER FUNCTION TESTS: No results for input(s): BILITOT, AST, ALT, ALKPHOS, PROT, ALBUMIN in the last 8760 hours.  TUMOR MARKERS: No results for input(s): AFPTM, CEA, CA199, CHROMGRNA in the last 8760 hours.  Assessment and Plan:  Newly diagnosed L ICA aneurysm Symptoms of forgetfulness and memory loss Scheduled for cerebral arteriogram today Risks and Benefits  discussed with the patient including, but not limited to bleeding, infection, vascular injury, contrast induced renal failure, stroke or even death. All of the patient's questions were answered, patient is agreeable to proceed. Consent signed and in chart.  Thank you for this interesting consult.  I greatly enjoyed meeting Nicole May and look forward to participating in their care.  A copy of this report was sent to the requesting provider on this date.  Electronically Signed: Darria Corvera A 01/02/2016, 8:00 AM   I spent a total of  30 Minutes   in face to face in clinical consultation, greater than 50% of which was counseling/coordinating care for cerebral arteriogram

## 2016-01-02 NOTE — Progress Notes (Signed)
Right groin checked with RN. Dressing dry and intact.

## 2016-01-02 NOTE — Sedation Documentation (Signed)
5 Fr. Exoseal to right groin 

## 2016-01-05 ENCOUNTER — Encounter (HOSPITAL_COMMUNITY): Payer: Self-pay | Admitting: Interventional Radiology

## 2016-01-07 DIAGNOSIS — J449 Chronic obstructive pulmonary disease, unspecified: Secondary | ICD-10-CM | POA: Diagnosis not present

## 2016-02-06 DIAGNOSIS — J449 Chronic obstructive pulmonary disease, unspecified: Secondary | ICD-10-CM | POA: Diagnosis not present

## 2016-02-28 DIAGNOSIS — J449 Chronic obstructive pulmonary disease, unspecified: Secondary | ICD-10-CM | POA: Diagnosis not present

## 2016-02-28 DIAGNOSIS — E559 Vitamin D deficiency, unspecified: Secondary | ICD-10-CM | POA: Diagnosis not present

## 2016-02-28 DIAGNOSIS — Z23 Encounter for immunization: Secondary | ICD-10-CM | POA: Diagnosis not present

## 2016-02-28 DIAGNOSIS — Z1389 Encounter for screening for other disorder: Secondary | ICD-10-CM | POA: Diagnosis not present

## 2016-02-28 DIAGNOSIS — E785 Hyperlipidemia, unspecified: Secondary | ICD-10-CM | POA: Diagnosis not present

## 2016-02-28 DIAGNOSIS — I10 Essential (primary) hypertension: Secondary | ICD-10-CM | POA: Diagnosis not present

## 2016-02-28 DIAGNOSIS — E1142 Type 2 diabetes mellitus with diabetic polyneuropathy: Secondary | ICD-10-CM | POA: Diagnosis not present

## 2016-03-08 DIAGNOSIS — J449 Chronic obstructive pulmonary disease, unspecified: Secondary | ICD-10-CM | POA: Diagnosis not present

## 2016-04-07 DIAGNOSIS — J449 Chronic obstructive pulmonary disease, unspecified: Secondary | ICD-10-CM | POA: Diagnosis not present

## 2016-05-08 DIAGNOSIS — J449 Chronic obstructive pulmonary disease, unspecified: Secondary | ICD-10-CM | POA: Diagnosis not present

## 2016-05-28 DIAGNOSIS — H43393 Other vitreous opacities, bilateral: Secondary | ICD-10-CM | POA: Diagnosis not present

## 2016-05-28 DIAGNOSIS — E119 Type 2 diabetes mellitus without complications: Secondary | ICD-10-CM | POA: Diagnosis not present

## 2016-06-08 DIAGNOSIS — J449 Chronic obstructive pulmonary disease, unspecified: Secondary | ICD-10-CM | POA: Diagnosis not present

## 2016-06-20 ENCOUNTER — Other Ambulatory Visit (HOSPITAL_COMMUNITY): Payer: Self-pay | Admitting: Interventional Radiology

## 2016-06-20 DIAGNOSIS — I729 Aneurysm of unspecified site: Secondary | ICD-10-CM

## 2016-07-04 ENCOUNTER — Encounter (HOSPITAL_COMMUNITY): Payer: Self-pay

## 2016-07-04 ENCOUNTER — Ambulatory Visit (HOSPITAL_COMMUNITY): Payer: Medicare Other

## 2016-07-04 ENCOUNTER — Ambulatory Visit (HOSPITAL_COMMUNITY)
Admission: RE | Admit: 2016-07-04 | Discharge: 2016-07-04 | Disposition: A | Discharge status: Home / Self Care | Payer: Medicare Other | Source: Ambulatory Visit | Attending: Interventional Radiology | Admitting: Interventional Radiology

## 2016-07-04 DIAGNOSIS — I729 Aneurysm of unspecified site: Secondary | ICD-10-CM | POA: Diagnosis present

## 2016-07-04 DIAGNOSIS — I720 Aneurysm of carotid artery: Secondary | ICD-10-CM | POA: Insufficient documentation

## 2016-07-04 LAB — CREATININE, SERUM
CREATININE: 1.01 mg/dL — AB (ref 0.44–1.00)
GFR calc Af Amer: >60 mL/min (ref >60)
GFR calc non Af Amer: 53 mL/min — ABNORMAL LOW (ref >60)

## 2016-07-04 MED ORDER — GADOBENATE DIMEGLUMINE 529 MG/ML IV SOLN
18.0000 mL | Freq: Once | INTRAVENOUS | Status: AC | PRN
  Administered 2016-07-04: 18 mL via INTRAVENOUS

## 2016-07-06 DIAGNOSIS — J449 Chronic obstructive pulmonary disease, unspecified: Secondary | ICD-10-CM | POA: Diagnosis not present

## 2016-07-11 DIAGNOSIS — R413 Other amnesia: Secondary | ICD-10-CM | POA: Diagnosis not present

## 2016-07-11 DIAGNOSIS — R269 Unspecified abnormalities of gait and mobility: Secondary | ICD-10-CM | POA: Diagnosis not present

## 2016-07-11 DIAGNOSIS — M5416 Radiculopathy, lumbar region: Secondary | ICD-10-CM | POA: Diagnosis not present

## 2016-07-18 ENCOUNTER — Telehealth (HOSPITAL_COMMUNITY): Payer: Self-pay

## 2016-07-18 NOTE — Telephone Encounter (Signed)
Pt agreed to f/u in 6 months with mri/mra. AW  

## 2016-08-06 DIAGNOSIS — J449 Chronic obstructive pulmonary disease, unspecified: Secondary | ICD-10-CM | POA: Diagnosis not present

## 2016-08-26 DIAGNOSIS — H401131 Primary open-angle glaucoma, bilateral, mild stage: Secondary | ICD-10-CM | POA: Diagnosis not present

## 2016-09-05 DIAGNOSIS — J449 Chronic obstructive pulmonary disease, unspecified: Secondary | ICD-10-CM | POA: Diagnosis not present

## 2016-09-09 DIAGNOSIS — I10 Essential (primary) hypertension: Secondary | ICD-10-CM | POA: Diagnosis not present

## 2016-09-09 DIAGNOSIS — Z87891 Personal history of nicotine dependence: Secondary | ICD-10-CM | POA: Diagnosis not present

## 2016-09-09 DIAGNOSIS — Z79891 Long term (current) use of opiate analgesic: Secondary | ICD-10-CM | POA: Diagnosis not present

## 2016-09-09 DIAGNOSIS — Z139 Encounter for screening, unspecified: Secondary | ICD-10-CM | POA: Diagnosis not present

## 2016-09-09 DIAGNOSIS — Z9181 History of falling: Secondary | ICD-10-CM | POA: Diagnosis not present

## 2016-09-09 DIAGNOSIS — E1142 Type 2 diabetes mellitus with diabetic polyneuropathy: Secondary | ICD-10-CM | POA: Diagnosis not present

## 2016-09-09 DIAGNOSIS — J449 Chronic obstructive pulmonary disease, unspecified: Secondary | ICD-10-CM | POA: Diagnosis not present

## 2016-09-09 DIAGNOSIS — E785 Hyperlipidemia, unspecified: Secondary | ICD-10-CM | POA: Diagnosis not present

## 2016-09-09 DIAGNOSIS — E559 Vitamin D deficiency, unspecified: Secondary | ICD-10-CM | POA: Diagnosis not present

## 2016-09-09 DIAGNOSIS — Z23 Encounter for immunization: Secondary | ICD-10-CM | POA: Diagnosis not present

## 2016-09-09 DIAGNOSIS — Z720 Tobacco use: Secondary | ICD-10-CM | POA: Diagnosis not present

## 2016-10-02 DIAGNOSIS — L821 Other seborrheic keratosis: Secondary | ICD-10-CM | POA: Diagnosis not present

## 2016-10-02 DIAGNOSIS — L578 Other skin changes due to chronic exposure to nonionizing radiation: Secondary | ICD-10-CM | POA: Diagnosis not present

## 2016-10-02 DIAGNOSIS — L918 Other hypertrophic disorders of the skin: Secondary | ICD-10-CM | POA: Diagnosis not present

## 2016-10-02 DIAGNOSIS — L853 Xerosis cutis: Secondary | ICD-10-CM | POA: Diagnosis not present

## 2016-10-02 DIAGNOSIS — L820 Inflamed seborrheic keratosis: Secondary | ICD-10-CM | POA: Diagnosis not present

## 2016-10-06 DIAGNOSIS — J449 Chronic obstructive pulmonary disease, unspecified: Secondary | ICD-10-CM | POA: Diagnosis not present

## 2016-11-05 DIAGNOSIS — J449 Chronic obstructive pulmonary disease, unspecified: Secondary | ICD-10-CM | POA: Diagnosis not present

## 2016-12-06 DIAGNOSIS — J449 Chronic obstructive pulmonary disease, unspecified: Secondary | ICD-10-CM | POA: Diagnosis not present

## 2016-12-18 DIAGNOSIS — H401131 Primary open-angle glaucoma, bilateral, mild stage: Secondary | ICD-10-CM | POA: Diagnosis not present

## 2016-12-24 ENCOUNTER — Other Ambulatory Visit (HOSPITAL_COMMUNITY): Payer: Self-pay | Admitting: Interventional Radiology

## 2016-12-24 DIAGNOSIS — I729 Aneurysm of unspecified site: Secondary | ICD-10-CM

## 2017-01-03 ENCOUNTER — Ambulatory Visit (HOSPITAL_COMMUNITY): Payer: Medicare Other

## 2017-01-06 DIAGNOSIS — J449 Chronic obstructive pulmonary disease, unspecified: Secondary | ICD-10-CM | POA: Diagnosis not present

## 2017-01-08 ENCOUNTER — Ambulatory Visit (HOSPITAL_COMMUNITY): Admission: RE | Admit: 2017-01-08 | Payer: Medicare Other | Source: Ambulatory Visit

## 2017-01-08 ENCOUNTER — Ambulatory Visit (HOSPITAL_COMMUNITY)
Admission: RE | Admit: 2017-01-08 | Discharge: 2017-01-08 | Disposition: A | Discharge status: Home / Self Care | Payer: Medicare Other | Source: Ambulatory Visit | Attending: Interventional Radiology | Admitting: Interventional Radiology

## 2017-01-08 DIAGNOSIS — I6503 Occlusion and stenosis of bilateral vertebral arteries: Secondary | ICD-10-CM | POA: Insufficient documentation

## 2017-01-08 DIAGNOSIS — I720 Aneurysm of carotid artery: Secondary | ICD-10-CM | POA: Insufficient documentation

## 2017-01-08 DIAGNOSIS — I671 Cerebral aneurysm, nonruptured: Secondary | ICD-10-CM | POA: Diagnosis not present

## 2017-01-08 DIAGNOSIS — I729 Aneurysm of unspecified site: Secondary | ICD-10-CM

## 2017-01-08 LAB — CREATININE, SERUM
Creatinine, Ser: 1.1 mg/dL — ABNORMAL HIGH (ref 0.44–1.00)
GFR calc Af Amer: 56 mL/min — ABNORMAL LOW (ref >60)
GFR calc non Af Amer: 48 mL/min — ABNORMAL LOW (ref >60)

## 2017-01-08 MED ORDER — GADOBENATE DIMEGLUMINE 529 MG/ML IV SOLN
18.0000 mL | Freq: Once | INTRAVENOUS | Status: AC | PRN
  Administered 2017-01-08: 18 mL via INTRAVENOUS

## 2017-01-21 ENCOUNTER — Telehealth (HOSPITAL_COMMUNITY): Payer: Self-pay

## 2017-01-21 NOTE — Telephone Encounter (Signed)
Pt will call back to schedule angio once she talks to granddaughter. AW

## 2017-01-22 ENCOUNTER — Other Ambulatory Visit (HOSPITAL_COMMUNITY): Payer: Self-pay | Admitting: Interventional Radiology

## 2017-01-22 DIAGNOSIS — I729 Aneurysm of unspecified site: Secondary | ICD-10-CM

## 2017-01-22 DIAGNOSIS — I771 Stricture of artery: Secondary | ICD-10-CM

## 2017-02-04 ENCOUNTER — Ambulatory Visit (HOSPITAL_COMMUNITY): Payer: Medicare Other

## 2017-02-05 DIAGNOSIS — J449 Chronic obstructive pulmonary disease, unspecified: Secondary | ICD-10-CM | POA: Diagnosis not present

## 2017-02-11 ENCOUNTER — Ambulatory Visit (HOSPITAL_COMMUNITY): Admission: RE | Admit: 2017-02-11 | Payer: Medicare Other | Source: Ambulatory Visit

## 2017-02-26 ENCOUNTER — Other Ambulatory Visit: Payer: Self-pay | Admitting: Radiology

## 2017-02-27 ENCOUNTER — Telehealth (HOSPITAL_COMMUNITY): Payer: Self-pay | Admitting: *Deleted

## 2017-02-27 ENCOUNTER — Encounter (HOSPITAL_COMMUNITY): Payer: Self-pay

## 2017-02-27 ENCOUNTER — Other Ambulatory Visit (HOSPITAL_COMMUNITY): Payer: Self-pay | Admitting: Interventional Radiology

## 2017-02-27 ENCOUNTER — Ambulatory Visit (HOSPITAL_COMMUNITY)
Admission: RE | Admit: 2017-02-27 | Discharge: 2017-02-27 | Disposition: A | Discharge status: Home / Self Care | Payer: Medicare Other | Source: Ambulatory Visit | Attending: Interventional Radiology | Admitting: Interventional Radiology

## 2017-02-27 DIAGNOSIS — F17210 Nicotine dependence, cigarettes, uncomplicated: Secondary | ICD-10-CM | POA: Insufficient documentation

## 2017-02-27 DIAGNOSIS — J449 Chronic obstructive pulmonary disease, unspecified: Secondary | ICD-10-CM | POA: Insufficient documentation

## 2017-02-27 DIAGNOSIS — I771 Stricture of artery: Secondary | ICD-10-CM

## 2017-02-27 DIAGNOSIS — I729 Aneurysm of unspecified site: Secondary | ICD-10-CM

## 2017-02-27 DIAGNOSIS — R7303 Prediabetes: Secondary | ICD-10-CM | POA: Diagnosis not present

## 2017-02-27 DIAGNOSIS — R930 Abnormal findings on diagnostic imaging of skull and head, not elsewhere classified: Secondary | ICD-10-CM | POA: Diagnosis not present

## 2017-02-27 DIAGNOSIS — I663 Occlusion and stenosis of cerebellar arteries: Secondary | ICD-10-CM | POA: Insufficient documentation

## 2017-02-27 DIAGNOSIS — Z7982 Long term (current) use of aspirin: Secondary | ICD-10-CM | POA: Diagnosis not present

## 2017-02-27 DIAGNOSIS — F419 Anxiety disorder, unspecified: Secondary | ICD-10-CM | POA: Insufficient documentation

## 2017-02-27 DIAGNOSIS — I10 Essential (primary) hypertension: Secondary | ICD-10-CM | POA: Diagnosis not present

## 2017-02-27 DIAGNOSIS — I671 Cerebral aneurysm, nonruptured: Secondary | ICD-10-CM | POA: Diagnosis not present

## 2017-02-27 HISTORY — PX: IR ANGIO INTRA EXTRACRAN SEL COM CAROTID INNOMINATE BILAT MOD SED: IMG5360

## 2017-02-27 HISTORY — PX: IR ANGIO VERTEBRAL SEL VERTEBRAL BILAT MOD SED: IMG5369

## 2017-02-27 LAB — PROTIME-INR
INR: 0.95
Prothrombin Time: 12.6 seconds (ref 11.4–15.2)

## 2017-02-27 LAB — CBC
HCT: 46.1 % — ABNORMAL HIGH (ref 36.0–46.0)
Hemoglobin: 14.4 g/dL (ref 12.0–15.0)
MCH: 33.1 pg (ref 26.0–34.0)
MCHC: 31.2 g/dL (ref 30.0–36.0)
MCV: 106 fL — ABNORMAL HIGH (ref 78.0–100.0)
Platelets: 137 10*3/uL — ABNORMAL LOW (ref 150–400)
RBC: 4.35 MIL/uL (ref 3.87–5.11)
RDW: 13.8 % (ref 11.5–15.5)
WBC: 4.7 10*3/uL (ref 4.0–10.5)

## 2017-02-27 LAB — BASIC METABOLIC PANEL
Anion gap: 9 (ref 5–15)
BUN: 16 mg/dL (ref 6–20)
CO2: 28 mmol/L (ref 22–32)
Calcium: 9.2 mg/dL (ref 8.9–10.3)
Chloride: 105 mmol/L (ref 101–111)
Creatinine, Ser: 1.02 mg/dL — ABNORMAL HIGH (ref 0.44–1.00)
GFR calc Af Amer: >60 mL/min (ref >60)
GFR calc non Af Amer: 53 mL/min — ABNORMAL LOW (ref >60)
Glucose, Bld: 106 mg/dL — ABNORMAL HIGH (ref 65–99)
Potassium: 4.6 mmol/L (ref 3.5–5.1)
Sodium: 142 mmol/L (ref 135–145)

## 2017-02-27 LAB — APTT: aPTT: 30 seconds (ref 24–36)

## 2017-02-27 MED ORDER — HEPARIN SOD (PORK) LOCK FLUSH 100 UNIT/ML IV SOLN
INTRAVENOUS | Status: AC
  Filled 2017-02-27: qty 10

## 2017-02-27 MED ORDER — MIDAZOLAM HCL 2 MG/2ML IJ SOLN
INTRAMUSCULAR | Status: AC
  Filled 2017-02-27: qty 2

## 2017-02-27 MED ORDER — MIDAZOLAM HCL 2 MG/2ML IJ SOLN
INTRAMUSCULAR | Status: AC | PRN
  Administered 2017-02-27: 1 mg via INTRAVENOUS

## 2017-02-27 MED ORDER — SODIUM CHLORIDE 0.9 % IV SOLN
Freq: Once | INTRAVENOUS | Status: AC
  Administered 2017-02-27: 09:00:00 via INTRAVENOUS

## 2017-02-27 MED ORDER — IOPAMIDOL (ISOVUE-300) INJECTION 61%
INTRAVENOUS | Status: AC
  Administered 2017-02-27: 70 mL
  Filled 2017-02-27: qty 150

## 2017-02-27 MED ORDER — LIDOCAINE HCL (PF) 1 % IJ SOLN
INTRAMUSCULAR | Status: AC | PRN
  Administered 2017-02-27: 8 mL

## 2017-02-27 MED ORDER — IOPAMIDOL (ISOVUE-300) INJECTION 61%
INTRAVENOUS | Status: AC
  Filled 2017-02-27: qty 50

## 2017-02-27 MED ORDER — FENTANYL CITRATE (PF) 100 MCG/2ML IJ SOLN
INTRAMUSCULAR | Status: AC | PRN
  Administered 2017-02-27 (×2): 25 ug via INTRAVENOUS

## 2017-02-27 MED ORDER — LIDOCAINE HCL 1 % IJ SOLN
INTRAMUSCULAR | Status: AC
  Filled 2017-02-27: qty 20

## 2017-02-27 MED ORDER — HEPARIN SODIUM (PORCINE) 1000 UNIT/ML IJ SOLN
INTRAMUSCULAR | Status: AC | PRN
  Administered 2017-02-27: 1000 [IU] via INTRAVENOUS

## 2017-02-27 MED ORDER — SODIUM CHLORIDE 0.9 % IV SOLN: INTRAVENOUS | Status: DC

## 2017-02-27 MED ORDER — FENTANYL CITRATE (PF) 100 MCG/2ML IJ SOLN
INTRAMUSCULAR | Status: AC
  Filled 2017-02-27: qty 2

## 2017-02-27 NOTE — Discharge Instructions (Addendum)
Cerebral Angiogram, Care After °Refer to this sheet in the next few weeks. These instructions provide you with information on caring for yourself after your procedure. Your health care provider may also give you more specific instructions. Your treatment has been planned according to current medical practices, but problems sometimes occur. Call your health care provider if you have any problems or questions after your procedure. °What can I expect after the procedure? °After your procedure, it is typical to have the following: °· Bruising at the catheter insertion site that usually fades within 1-2 weeks. °· Blood collecting in the tissue (hematoma) that may be painful to the touch. It should usually decrease in size and tenderness within 1-2 weeks. °· A mild headache. ° °Follow these instructions at home: °· Take medicines only as directed by your health care provider. °· You may shower 24-48 hours after the procedure or as directed by your health care provider. Remove the bandage (dressing) and gently wash the site with plain soap and water. Pat the area dry with a clean towel. Do not rub the site, because this may cause bleeding. °· Do not take baths, swim, or use a hot tub until your health care provider approves. °· Check your insertion site every day for redness, swelling, or drainage. °· Do not apply powder or lotion to the site. °· Do not lift over 10 lb (4.5 kg) for 5 days after your procedure or as directed by your health care provider. °· Ask your health care provider when it is okay to: °? Return to work or school. °? Resume usual physical activities or sports. °? Resume sexual activity. °· Do not drive home if you are discharged the same day as the procedure. Have someone else drive you. °· You may drive 24 hours after the procedure unless otherwise instructed by your health care provider. °· Do not operate machinery or power tools for 24 hours after the procedure or as directed by your health care  provider. °· If your procedure was done as an outpatient procedure, which means that you went home the same day as your procedure, a responsible adult should be with you for the first 24 hours after you arrive home. °· Keep all follow-up visits as directed by your health care provider. This is important. °Contact a health care provider if: °· You have a fever. °· You have chills. °· You have increased bleeding from the catheter insertion site. Hold pressure on the site. °Get help right away if: °· You have vision changes or loss of vision. °· You have numbness or weakness on one side of your body. °· You have difficulty talking, or you have slurred speech or cannot speak (aphasia). °· You feel confused or have difficulty remembering. °· You have unusual pain at the catheter insertion site. °· You have redness, warmth, or swelling at the catheter insertion site. °· You have drainage (other than a small amount of blood on the dressing) from the catheter insertion site. °· The catheter insertion site is bleeding, and the bleeding does not stop after 30 minutes of holding steady pressure on the site. °These symptoms may represent a serious problem that is an emergency. Do not wait to see if the symptoms will go away. Get medical help right away. Call your local emergency services (911 in [place] Do not drive yourself to the hospital. °This information is not intended to replace advice given to you by your health care provider. Make sure you discuss any questions   you have with your health care provider. °Document Released: 08/23/2013 Document Revised: 09/14/2015 Document Reviewed: 04/21/2013 °Elsevier Interactive Patient Education © 2017 [employer] °Moderate Conscious Sedation, Adult, Care After °These instructions provide you with information about caring for yourself after your procedure. Your health care provider may also give you more specific instructions. Your treatment has been planned according to current  medical practices, but problems sometimes occur. Call your health care provider if you have any problems or questions after your procedure. °What can I expect after the procedure? °After your procedure, it is common: °· To feel sleepy for several hours. °· To feel clumsy and have poor balance for several hours. °· To have poor judgment for several hours. °· To vomit if you eat too soon. ° °Follow these instructions at home: °For at least 24 hours after the procedure: ° °· Do not: °? Participate in activities where you could fall or become injured. °? Drive. °? Use heavy machinery. °? Drink alcohol. °? Take sleeping pills or medicines that cause drowsiness. °? Make important decisions or sign legal documents. °? Take care of children on your own. °· Rest. °Eating and drinking °· Follow the diet recommended by your health care provider. °· If you vomit: °? Drink water, juice, or soup when you can drink without vomiting. °? Make sure you have little or no nausea before eating solid foods. °General instructions °· Have a responsible adult stay with you until you are awake and alert. °· Take over-the-counter and prescription medicines only as told by your health care provider. °· If you smoke, do not smoke without supervision. °· Keep all follow-up visits as told by your health care provider. This is important. °Contact a health care provider if: °· You keep feeling nauseous or you keep vomiting. °· You feel light-headed. °· You develop a rash. °· You have a fever. °Get help right away if: °· You have trouble breathing. °This information is not intended to replace advice given to you by your health care provider. Make sure you discuss any questions you have with your health care provider. °Document Released: 01/27/2013 Document Revised: 09/11/2015 Document Reviewed: 07/29/2015 °Elsevier Interactive Patient Education © 2018 [employer] ° °

## 2017-02-27 NOTE — Sedation Documentation (Signed)
Patient is resting comfortably. 

## 2017-02-27 NOTE — H&P (Signed)
Chief Complaint: Cerebral aneurysm  Referring Physician(s): Deveshwar,Sanjeev  Supervising Physician: Luanne Bras  Patient Status: [facility] - Out-pt  History of Present Illness: Nicole May is a 76 y.o. female here today for carotid/cerebral angiography to follow her known left internal carotid aneurysm.  She has previously undergone cerebral angiography by Dr. Estanislado Pandy. This was done last September.  Angiography showed a 9 mm x 4.5 mm wide-based aneurysm arising from the inferior wall of the left internal carotid artery cavernous segment.  It was decided at that time to continue with conservative surveillance.  MRI done 01/07/2017 showed the aneurysm measured 6 x 8 mm.  She has no new complaints.  She is NPO.  Past Medical History:  Diagnosis Date  . Anxiety   . COPD (chronic obstructive pulmonary disease) ([place])   . Hypertension   . Kidney stone on right side 2017  . Pre-diabetes     Past Surgical History:  Procedure Laterality Date  . EYE SURGERY    . HERNIA REPAIR    . IR GENERIC HISTORICAL  12/19/2015   IR RADIOLOGIST EVAL & MGMT 12/19/2015 [facility]  . IR GENERIC HISTORICAL  01/02/2016   IR ANGIO VERTEBRAL SEL SUBCLAVIAN INNOMINATE UNI R MOD SED 01/02/2016 Luanne Bras, MD [facility]  . IR GENERIC HISTORICAL  01/02/2016   IR ANGIO VERTEBRAL SEL VERTEBRAL UNI L MOD SED 01/02/2016 Luanne Bras, MD [facility]  . IR GENERIC HISTORICAL  01/02/2016   IR ANGIO INTRA EXTRACRAN SEL COM CAROTID INNOMINATE BILAT MOD SED 01/02/2016 Luanne Bras, MD [facility]    Allergies: Patient has no known allergies.  Medications: Prior to Admission medications   Medication Sig Start Date End Date Taking? Authorizing Provider  albuterol (PROVENTIL HFA;VENTOLIN HFA) 108 (90 Base) MCG/ACT inhaler Inhale 2 puffs into the lungs every 4 (four) hours as needed for wheezing or shortness of breath.   Yes [provider]  ALPRAZolam Duanne Moron) 1 MG tablet  Take 1 mg by mouth 2 (two) times daily.    Yes [provider]  aspirin 325 MG tablet Take 325 mg by mouth daily as needed for headache.   Yes [provider]  Cholecalciferol (VITAMIN D3) 5000 units TABS Take 1 tablet by mouth daily.   Yes [provider]  DiphenhydrAMINE HCl, Sleep, (UNISOM SLEEPGELS) 50 MG CAPS Take 1 capsule by mouth at bedtime.   Yes [provider]  escitalopram (LEXAPRO) 20 MG tablet Take 20 mg by mouth daily.   Yes [provider]  gabapentin (NEURONTIN) 300 MG capsule Take 300 mg by mouth 2 (two) times daily.    Yes [provider]  hydrALAZINE (APRESOLINE) 25 MG tablet Take 25 mg by mouth 2 (two) times daily.   Yes [provider]  latanoprost (XALATAN) 0.005 % ophthalmic solution Place 1 drop into both eyes at bedtime.   Yes [provider]  lisinopril (PRINIVIL,ZESTRIL) 40 MG tablet Take 40 mg by mouth daily.   Yes [provider]  metoprolol succinate (TOPROL-XL) 25 MG 24 hr tablet Take 25 mg by mouth every morning.   Yes [provider]  nystatin cream (MYCOSTATIN) Apply 1 application topically 2 (two) times daily as needed for dry skin.   Yes [provider]  ondansetron (ZOFRAN) 4 MG tablet Take 4 mg by mouth every 8 (eight) hours as needed for nausea or vomiting.   Yes [provider]  OXYGEN Inhale 2 L into the lungs continuous.   Yes [provider]  furosemide (LASIX) 40 MG tablet Take 40 mg by mouth daily as needed for fluid.    [provider]  Multiple Vitamins-Minerals (HAIR SKIN AND NAILS FORMULA PO) Take 1 tablet by mouth daily.    [provider]  Multiple Vitamins-Minerals (WOMENS MULTIVITAMIN) TABS Take 1 tablet by mouth daily.    [provider]  rOPINIRole (REQUIP) 0.5 MG tablet Take 0.5 mg by mouth at bedtime as needed (Leg pain).    [provider]     Family History  Problem Relation Age of Onset   . Cancer - Lung Mother   . Kidney disease Father     Social History   Socioeconomic History  . Marital status: Widowed    Spouse name: None  . Number of children: None  . Years of education: None  . Highest education level: None  Social Needs  . Financial resource strain: None  . Food insecurity - worry: None  . Food insecurity - inability: None  . Transportation needs - medical: None  . Transportation needs - non-medical: None  Occupational History  . None  Tobacco Use  . Smoking status: Current Every Day Smoker    Packs/day: 1.00    Years: 15.00    Pack years: 15.00    Types: Cigarettes  . Smokeless tobacco: Never Used  Substance and Sexual Activity  . Alcohol use: None  . Drug use: None  . Sexual activity: None  Other Topics Concern  . None  Social History Narrative  . None    Review of Systems: A 12 point ROS discussed Review of Systems  Constitutional: Negative.   HENT: Negative.   Respiratory: Negative.   Cardiovascular: Negative.   Gastrointestinal: Negative.   Genitourinary: Negative.   Musculoskeletal: Negative.   Neurological: Negative.   Hematological: Negative.   Psychiatric/Behavioral: Negative.     Vital Signs: Pulse 60   Temp 98.5 F (36.9 C) (Oral)   Ht 5\' 1"  (1.549 m)   Wt 180 lb (81.6 kg)   SpO2 92%   BMI 34.01 kg/m   Physical Exam  Constitutional: She is oriented to person, place, and time. She appears well-developed.  HENT:  Head: Normocephalic and atraumatic.  Eyes: EOM are normal.  Neck: Normal range of motion.  Cardiovascular: Normal rate, regular rhythm and normal heart sounds.  Pulmonary/Chest: Effort normal and breath sounds normal.  Abdominal: Soft. She exhibits no distension. There is no tenderness.  Musculoskeletal: Normal range of motion.  Neurological: She is alert and oriented to person, place, and time.  Skin: Skin is warm and dry.  Psychiatric: She has a normal mood and affect. Her behavior is normal.  Judgment and thought content normal.  Vitals reviewed.   Imaging: No results found.  Labs:  CBC: Recent Labs    02/27/17 0851  WBC 4.7  HGB 14.4  HCT 46.1*  PLT 137*    COAGS: No results for input(s): INR, APTT in the last 8760 hours.  BMP: Recent Labs    07/04/16 1255 01/08/17 1618  CREATININE 1.01* 1.10*  GFRNONAA 53* 48*  GFRAA >60 56*    LIVER FUNCTION TESTS: No results for input(s): BILITOT, AST, ALT, ALKPHOS, PROT, ALBUMIN in the last 8760 hours.  TUMOR MARKERS: No results for input(s): AFPTM, CEA, CA199, CHROMGRNA in the last 8760 hours.  Assessment and Plan:  A 9 mm x 4.5 mm wide-based aneurysm arising from the inferior wall of the left internal carotid artery cavernous segment.  Will proceed  with carotid/cerebral angiography today by Dr. Estanislado Pandy.  Risks and benefits of cerebral angiography were discussed with the patient including, but not limited to bleeding, infection, vascular injury, contrast induced renal failure, stroke or even death.  This interventional procedure involves the use of X-rays and because of the nature of the planned procedure, it is possible that we will have prolonged use of X-ray fluoroscopy.  Potential radiation risks to you include (but are not limited to) the following: - A slightly elevated risk for cancer  several years later in life. This risk is typically less than 0.5% percent. This risk is low in comparison to the normal incidence of human cancer, which is 33% for women and 50% for men according to the [place]. - Radiation induced injury can include skin redness, resembling a rash, tissue breakdown / ulcers and hair loss (which can be temporary or permanent).   The likelihood of either of these occurring depends on the difficulty of the procedure and whether you are sensitive to radiation due to previous procedures, disease, or genetic conditions.   IF your procedure requires a prolonged use of  radiation, you will be notified and given written instructions for further action.  It is your responsibility to monitor the irradiated area for the 2 weeks following the procedure and to notify your physician if you are concerned that you have suffered a radiation induced injury.    All of the patient's questions were answered, patient is agreeable to proceed.  Consent signed and in chart.   Electronically Signed: Murrell Redden, PA-C 02/27/2017, 9:44 AM   I spent a total of  25 Minutes in face to face in clinical consultation, greater than 50% of which was counseling/coordinating care for cerebral angiography.

## 2017-02-27 NOTE — Procedures (Signed)
S/P 4 vessel cerebral arteriogram. RT CFA approach. Findings. 1.Approx 8.27mm x 8.5 mm Lt ICA prox cavernous fusiform aneurysm?changed 2.VAs and VBJs widely patent

## 2017-03-03 ENCOUNTER — Encounter (HOSPITAL_COMMUNITY): Payer: Self-pay | Admitting: Interventional Radiology

## 2017-03-05 ENCOUNTER — Other Ambulatory Visit (HOSPITAL_COMMUNITY): Payer: Self-pay | Admitting: Interventional Radiology

## 2017-03-05 DIAGNOSIS — I771 Stricture of artery: Secondary | ICD-10-CM

## 2017-03-05 DIAGNOSIS — I729 Aneurysm of unspecified site: Secondary | ICD-10-CM

## 2017-03-08 DIAGNOSIS — J449 Chronic obstructive pulmonary disease, unspecified: Secondary | ICD-10-CM | POA: Diagnosis not present

## 2017-03-17 DIAGNOSIS — Z23 Encounter for immunization: Secondary | ICD-10-CM | POA: Diagnosis not present

## 2017-03-17 DIAGNOSIS — I10 Essential (primary) hypertension: Secondary | ICD-10-CM | POA: Diagnosis not present

## 2017-03-17 DIAGNOSIS — E559 Vitamin D deficiency, unspecified: Secondary | ICD-10-CM | POA: Diagnosis not present

## 2017-03-17 DIAGNOSIS — E1142 Type 2 diabetes mellitus with diabetic polyneuropathy: Secondary | ICD-10-CM | POA: Diagnosis not present

## 2017-03-17 DIAGNOSIS — Z87891 Personal history of nicotine dependence: Secondary | ICD-10-CM | POA: Diagnosis not present

## 2017-03-17 DIAGNOSIS — Z79891 Long term (current) use of opiate analgesic: Secondary | ICD-10-CM | POA: Diagnosis not present

## 2017-03-17 DIAGNOSIS — J449 Chronic obstructive pulmonary disease, unspecified: Secondary | ICD-10-CM | POA: Diagnosis not present

## 2017-03-17 DIAGNOSIS — E785 Hyperlipidemia, unspecified: Secondary | ICD-10-CM | POA: Diagnosis not present

## 2017-03-19 DIAGNOSIS — Z7982 Long term (current) use of aspirin: Secondary | ICD-10-CM | POA: Diagnosis not present

## 2017-03-19 DIAGNOSIS — Z9981 Dependence on supplemental oxygen: Secondary | ICD-10-CM | POA: Diagnosis not present

## 2017-03-19 DIAGNOSIS — M160 Bilateral primary osteoarthritis of hip: Secondary | ICD-10-CM | POA: Diagnosis not present

## 2017-03-19 DIAGNOSIS — W010XXA Fall on same level from slipping, tripping and stumbling without subsequent striking against object, initial encounter: Secondary | ICD-10-CM | POA: Diagnosis not present

## 2017-03-19 DIAGNOSIS — N179 Acute kidney failure, unspecified: Secondary | ICD-10-CM | POA: Diagnosis not present

## 2017-03-19 DIAGNOSIS — R300 Dysuria: Secondary | ICD-10-CM | POA: Diagnosis not present

## 2017-03-19 DIAGNOSIS — S82841A Displaced bimalleolar fracture of right lower leg, initial encounter for closed fracture: Secondary | ICD-10-CM | POA: Diagnosis not present

## 2017-03-19 DIAGNOSIS — J9611 Chronic respiratory failure with hypoxia: Secondary | ICD-10-CM | POA: Diagnosis not present

## 2017-03-19 DIAGNOSIS — Z79899 Other long term (current) drug therapy: Secondary | ICD-10-CM | POA: Diagnosis not present

## 2017-03-19 DIAGNOSIS — W19XXXA Unspecified fall, initial encounter: Secondary | ICD-10-CM | POA: Diagnosis not present

## 2017-03-19 DIAGNOSIS — Z794 Long term (current) use of insulin: Secondary | ICD-10-CM | POA: Diagnosis not present

## 2017-03-19 DIAGNOSIS — S82841D Displaced bimalleolar fracture of right lower leg, subsequent encounter for closed fracture with routine healing: Secondary | ICD-10-CM | POA: Diagnosis not present

## 2017-03-19 DIAGNOSIS — G20 Parkinson's disease: Secondary | ICD-10-CM | POA: Diagnosis not present

## 2017-03-19 DIAGNOSIS — I10 Essential (primary) hypertension: Secondary | ICD-10-CM | POA: Diagnosis not present

## 2017-03-19 DIAGNOSIS — I671 Cerebral aneurysm, nonruptured: Secondary | ICD-10-CM | POA: Diagnosis not present

## 2017-03-19 DIAGNOSIS — T402X5A Adverse effect of other opioids, initial encounter: Secondary | ICD-10-CM | POA: Diagnosis not present

## 2017-03-19 DIAGNOSIS — G8918 Other acute postprocedural pain: Secondary | ICD-10-CM | POA: Diagnosis not present

## 2017-03-19 DIAGNOSIS — R52 Pain, unspecified: Secondary | ICD-10-CM | POA: Diagnosis not present

## 2017-03-19 DIAGNOSIS — Z4789 Encounter for other orthopedic aftercare: Secondary | ICD-10-CM | POA: Diagnosis not present

## 2017-03-19 DIAGNOSIS — E119 Type 2 diabetes mellitus without complications: Secondary | ICD-10-CM | POA: Diagnosis not present

## 2017-03-19 DIAGNOSIS — R410 Disorientation, unspecified: Secondary | ICD-10-CM | POA: Diagnosis not present

## 2017-03-19 DIAGNOSIS — S82842A Displaced bimalleolar fracture of left lower leg, initial encounter for closed fracture: Secondary | ICD-10-CM | POA: Diagnosis not present

## 2017-03-19 DIAGNOSIS — W19XXXD Unspecified fall, subsequent encounter: Secondary | ICD-10-CM | POA: Diagnosis not present

## 2017-03-19 DIAGNOSIS — M25571 Pain in right ankle and joints of right foot: Secondary | ICD-10-CM | POA: Diagnosis not present

## 2017-03-19 DIAGNOSIS — J9811 Atelectasis: Secondary | ICD-10-CM | POA: Diagnosis not present

## 2017-03-19 DIAGNOSIS — J449 Chronic obstructive pulmonary disease, unspecified: Secondary | ICD-10-CM | POA: Diagnosis not present

## 2017-03-19 DIAGNOSIS — S8251XA Displaced fracture of medial malleolus of right tibia, initial encounter for closed fracture: Secondary | ICD-10-CM | POA: Diagnosis not present

## 2017-03-19 DIAGNOSIS — T424X5A Adverse effect of benzodiazepines, initial encounter: Secondary | ICD-10-CM | POA: Diagnosis not present

## 2017-03-27 ENCOUNTER — Ambulatory Visit (HOSPITAL_COMMUNITY): Payer: Medicare Other

## 2017-03-28 DIAGNOSIS — J9611 Chronic respiratory failure with hypoxia: Secondary | ICD-10-CM | POA: Diagnosis not present

## 2017-03-28 DIAGNOSIS — R52 Pain, unspecified: Secondary | ICD-10-CM | POA: Diagnosis not present

## 2017-03-28 DIAGNOSIS — E119 Type 2 diabetes mellitus without complications: Secondary | ICD-10-CM | POA: Diagnosis not present

## 2017-03-28 DIAGNOSIS — I671 Cerebral aneurysm, nonruptured: Secondary | ICD-10-CM | POA: Diagnosis not present

## 2017-03-28 DIAGNOSIS — M160 Bilateral primary osteoarthritis of hip: Secondary | ICD-10-CM | POA: Diagnosis not present

## 2017-03-28 DIAGNOSIS — W19XXXD Unspecified fall, subsequent encounter: Secondary | ICD-10-CM | POA: Diagnosis not present

## 2017-03-28 DIAGNOSIS — S82891D Other fracture of right lower leg, subsequent encounter for closed fracture with routine healing: Secondary | ICD-10-CM | POA: Diagnosis not present

## 2017-03-28 DIAGNOSIS — Z4789 Encounter for other orthopedic aftercare: Secondary | ICD-10-CM | POA: Diagnosis not present

## 2017-03-28 DIAGNOSIS — J449 Chronic obstructive pulmonary disease, unspecified: Secondary | ICD-10-CM | POA: Diagnosis not present

## 2017-03-28 DIAGNOSIS — S82841D Displaced bimalleolar fracture of right lower leg, subsequent encounter for closed fracture with routine healing: Secondary | ICD-10-CM | POA: Diagnosis not present

## 2017-03-28 DIAGNOSIS — Z794 Long term (current) use of insulin: Secondary | ICD-10-CM | POA: Diagnosis not present

## 2017-03-28 DIAGNOSIS — W19XXXA Unspecified fall, initial encounter: Secondary | ICD-10-CM | POA: Diagnosis not present

## 2017-03-28 DIAGNOSIS — M25571 Pain in right ankle and joints of right foot: Secondary | ICD-10-CM | POA: Diagnosis not present

## 2017-03-31 ENCOUNTER — Ambulatory Visit (HOSPITAL_COMMUNITY): Payer: Medicare Other

## 2017-04-01 DIAGNOSIS — S82841D Displaced bimalleolar fracture of right lower leg, subsequent encounter for closed fracture with routine healing: Secondary | ICD-10-CM | POA: Diagnosis not present

## 2017-04-08 DIAGNOSIS — S82891D Other fracture of right lower leg, subsequent encounter for closed fracture with routine healing: Secondary | ICD-10-CM | POA: Diagnosis not present

## 2017-04-09 DIAGNOSIS — J449 Chronic obstructive pulmonary disease, unspecified: Secondary | ICD-10-CM | POA: Diagnosis not present

## 2017-04-16 DIAGNOSIS — W19XXXD Unspecified fall, subsequent encounter: Secondary | ICD-10-CM | POA: Diagnosis not present

## 2017-04-16 DIAGNOSIS — S82841D Displaced bimalleolar fracture of right lower leg, subsequent encounter for closed fracture with routine healing: Secondary | ICD-10-CM | POA: Diagnosis not present

## 2017-04-16 DIAGNOSIS — Z4789 Encounter for other orthopedic aftercare: Secondary | ICD-10-CM | POA: Diagnosis not present

## 2017-04-17 DIAGNOSIS — G20 Parkinson's disease: Secondary | ICD-10-CM | POA: Diagnosis not present

## 2017-04-17 DIAGNOSIS — W19XXXD Unspecified fall, subsequent encounter: Secondary | ICD-10-CM | POA: Diagnosis not present

## 2017-04-17 DIAGNOSIS — S82841D Displaced bimalleolar fracture of right lower leg, subsequent encounter for closed fracture with routine healing: Secondary | ICD-10-CM | POA: Diagnosis not present

## 2017-04-17 DIAGNOSIS — J9611 Chronic respiratory failure with hypoxia: Secondary | ICD-10-CM | POA: Diagnosis not present

## 2017-04-17 DIAGNOSIS — J449 Chronic obstructive pulmonary disease, unspecified: Secondary | ICD-10-CM | POA: Diagnosis not present

## 2017-04-17 DIAGNOSIS — I10 Essential (primary) hypertension: Secondary | ICD-10-CM | POA: Diagnosis not present

## 2017-04-17 DIAGNOSIS — E119 Type 2 diabetes mellitus without complications: Secondary | ICD-10-CM | POA: Diagnosis not present

## 2017-04-17 DIAGNOSIS — I671 Cerebral aneurysm, nonruptured: Secondary | ICD-10-CM | POA: Diagnosis not present

## 2017-04-17 DIAGNOSIS — M160 Bilateral primary osteoarthritis of hip: Secondary | ICD-10-CM | POA: Diagnosis not present

## 2017-04-17 DIAGNOSIS — Z9981 Dependence on supplemental oxygen: Secondary | ICD-10-CM | POA: Diagnosis not present

## 2017-04-17 DIAGNOSIS — Z9181 History of falling: Secondary | ICD-10-CM | POA: Diagnosis not present

## 2017-04-21 DIAGNOSIS — S82841D Displaced bimalleolar fracture of right lower leg, subsequent encounter for closed fracture with routine healing: Secondary | ICD-10-CM | POA: Diagnosis not present

## 2017-04-21 DIAGNOSIS — Z9181 History of falling: Secondary | ICD-10-CM | POA: Diagnosis not present

## 2017-04-21 DIAGNOSIS — J9611 Chronic respiratory failure with hypoxia: Secondary | ICD-10-CM | POA: Diagnosis not present

## 2017-04-21 DIAGNOSIS — Z9981 Dependence on supplemental oxygen: Secondary | ICD-10-CM | POA: Diagnosis not present

## 2017-04-21 DIAGNOSIS — G20 Parkinson's disease: Secondary | ICD-10-CM | POA: Diagnosis not present

## 2017-04-21 DIAGNOSIS — J449 Chronic obstructive pulmonary disease, unspecified: Secondary | ICD-10-CM | POA: Diagnosis not present

## 2017-04-21 DIAGNOSIS — M160 Bilateral primary osteoarthritis of hip: Secondary | ICD-10-CM | POA: Diagnosis not present

## 2017-04-21 DIAGNOSIS — I10 Essential (primary) hypertension: Secondary | ICD-10-CM | POA: Diagnosis not present

## 2017-04-21 DIAGNOSIS — E119 Type 2 diabetes mellitus without complications: Secondary | ICD-10-CM | POA: Diagnosis not present

## 2017-04-21 DIAGNOSIS — W19XXXD Unspecified fall, subsequent encounter: Secondary | ICD-10-CM | POA: Diagnosis not present

## 2017-04-21 DIAGNOSIS — I671 Cerebral aneurysm, nonruptured: Secondary | ICD-10-CM | POA: Diagnosis not present

## 2017-04-24 DIAGNOSIS — I10 Essential (primary) hypertension: Secondary | ICD-10-CM | POA: Diagnosis not present

## 2017-04-24 DIAGNOSIS — Z9981 Dependence on supplemental oxygen: Secondary | ICD-10-CM | POA: Diagnosis not present

## 2017-04-24 DIAGNOSIS — I671 Cerebral aneurysm, nonruptured: Secondary | ICD-10-CM | POA: Diagnosis not present

## 2017-04-24 DIAGNOSIS — J449 Chronic obstructive pulmonary disease, unspecified: Secondary | ICD-10-CM | POA: Diagnosis not present

## 2017-04-24 DIAGNOSIS — W19XXXD Unspecified fall, subsequent encounter: Secondary | ICD-10-CM | POA: Diagnosis not present

## 2017-04-24 DIAGNOSIS — G20 Parkinson's disease: Secondary | ICD-10-CM | POA: Diagnosis not present

## 2017-04-24 DIAGNOSIS — S82841D Displaced bimalleolar fracture of right lower leg, subsequent encounter for closed fracture with routine healing: Secondary | ICD-10-CM | POA: Diagnosis not present

## 2017-04-24 DIAGNOSIS — Z9181 History of falling: Secondary | ICD-10-CM | POA: Diagnosis not present

## 2017-04-24 DIAGNOSIS — J9611 Chronic respiratory failure with hypoxia: Secondary | ICD-10-CM | POA: Diagnosis not present

## 2017-04-24 DIAGNOSIS — E119 Type 2 diabetes mellitus without complications: Secondary | ICD-10-CM | POA: Diagnosis not present

## 2017-04-24 DIAGNOSIS — M160 Bilateral primary osteoarthritis of hip: Secondary | ICD-10-CM | POA: Diagnosis not present

## 2017-04-26 DIAGNOSIS — G20 Parkinson's disease: Secondary | ICD-10-CM | POA: Diagnosis not present

## 2017-04-26 DIAGNOSIS — I671 Cerebral aneurysm, nonruptured: Secondary | ICD-10-CM | POA: Diagnosis not present

## 2017-04-26 DIAGNOSIS — M160 Bilateral primary osteoarthritis of hip: Secondary | ICD-10-CM | POA: Diagnosis not present

## 2017-04-26 DIAGNOSIS — Z9981 Dependence on supplemental oxygen: Secondary | ICD-10-CM | POA: Diagnosis not present

## 2017-04-26 DIAGNOSIS — E119 Type 2 diabetes mellitus without complications: Secondary | ICD-10-CM | POA: Diagnosis not present

## 2017-04-26 DIAGNOSIS — Z9181 History of falling: Secondary | ICD-10-CM | POA: Diagnosis not present

## 2017-04-26 DIAGNOSIS — S82841D Displaced bimalleolar fracture of right lower leg, subsequent encounter for closed fracture with routine healing: Secondary | ICD-10-CM | POA: Diagnosis not present

## 2017-04-26 DIAGNOSIS — W19XXXD Unspecified fall, subsequent encounter: Secondary | ICD-10-CM | POA: Diagnosis not present

## 2017-04-26 DIAGNOSIS — I10 Essential (primary) hypertension: Secondary | ICD-10-CM | POA: Diagnosis not present

## 2017-04-26 DIAGNOSIS — J9611 Chronic respiratory failure with hypoxia: Secondary | ICD-10-CM | POA: Diagnosis not present

## 2017-04-26 DIAGNOSIS — J449 Chronic obstructive pulmonary disease, unspecified: Secondary | ICD-10-CM | POA: Diagnosis not present

## 2017-04-29 DIAGNOSIS — S82841D Displaced bimalleolar fracture of right lower leg, subsequent encounter for closed fracture with routine healing: Secondary | ICD-10-CM | POA: Diagnosis not present

## 2017-05-01 DIAGNOSIS — E119 Type 2 diabetes mellitus without complications: Secondary | ICD-10-CM | POA: Diagnosis not present

## 2017-05-01 DIAGNOSIS — S82841D Displaced bimalleolar fracture of right lower leg, subsequent encounter for closed fracture with routine healing: Secondary | ICD-10-CM | POA: Diagnosis not present

## 2017-05-01 DIAGNOSIS — G20 Parkinson's disease: Secondary | ICD-10-CM | POA: Diagnosis not present

## 2017-05-01 DIAGNOSIS — S82891A Other fracture of right lower leg, initial encounter for closed fracture: Secondary | ICD-10-CM | POA: Diagnosis not present

## 2017-05-01 DIAGNOSIS — J449 Chronic obstructive pulmonary disease, unspecified: Secondary | ICD-10-CM | POA: Diagnosis not present

## 2017-05-01 DIAGNOSIS — W19XXXD Unspecified fall, subsequent encounter: Secondary | ICD-10-CM | POA: Diagnosis not present

## 2017-05-01 DIAGNOSIS — Z9981 Dependence on supplemental oxygen: Secondary | ICD-10-CM | POA: Diagnosis not present

## 2017-05-01 DIAGNOSIS — Z9181 History of falling: Secondary | ICD-10-CM | POA: Diagnosis not present

## 2017-05-01 DIAGNOSIS — M160 Bilateral primary osteoarthritis of hip: Secondary | ICD-10-CM | POA: Diagnosis not present

## 2017-05-01 DIAGNOSIS — J9611 Chronic respiratory failure with hypoxia: Secondary | ICD-10-CM | POA: Diagnosis not present

## 2017-05-01 DIAGNOSIS — I10 Essential (primary) hypertension: Secondary | ICD-10-CM | POA: Diagnosis not present

## 2017-05-01 DIAGNOSIS — I671 Cerebral aneurysm, nonruptured: Secondary | ICD-10-CM | POA: Diagnosis not present

## 2017-05-07 DIAGNOSIS — W19XXXD Unspecified fall, subsequent encounter: Secondary | ICD-10-CM | POA: Diagnosis not present

## 2017-05-07 DIAGNOSIS — Z9181 History of falling: Secondary | ICD-10-CM | POA: Diagnosis not present

## 2017-05-07 DIAGNOSIS — J9611 Chronic respiratory failure with hypoxia: Secondary | ICD-10-CM | POA: Diagnosis not present

## 2017-05-07 DIAGNOSIS — I671 Cerebral aneurysm, nonruptured: Secondary | ICD-10-CM | POA: Diagnosis not present

## 2017-05-07 DIAGNOSIS — M160 Bilateral primary osteoarthritis of hip: Secondary | ICD-10-CM | POA: Diagnosis not present

## 2017-05-07 DIAGNOSIS — I10 Essential (primary) hypertension: Secondary | ICD-10-CM | POA: Diagnosis not present

## 2017-05-07 DIAGNOSIS — S82841D Displaced bimalleolar fracture of right lower leg, subsequent encounter for closed fracture with routine healing: Secondary | ICD-10-CM | POA: Diagnosis not present

## 2017-05-07 DIAGNOSIS — E119 Type 2 diabetes mellitus without complications: Secondary | ICD-10-CM | POA: Diagnosis not present

## 2017-05-07 DIAGNOSIS — G20 Parkinson's disease: Secondary | ICD-10-CM | POA: Diagnosis not present

## 2017-05-07 DIAGNOSIS — J449 Chronic obstructive pulmonary disease, unspecified: Secondary | ICD-10-CM | POA: Diagnosis not present

## 2017-05-07 DIAGNOSIS — Z9981 Dependence on supplemental oxygen: Secondary | ICD-10-CM | POA: Diagnosis not present

## 2017-05-08 DIAGNOSIS — J449 Chronic obstructive pulmonary disease, unspecified: Secondary | ICD-10-CM | POA: Diagnosis not present

## 2017-05-09 DIAGNOSIS — I10 Essential (primary) hypertension: Secondary | ICD-10-CM | POA: Diagnosis not present

## 2017-05-09 DIAGNOSIS — J449 Chronic obstructive pulmonary disease, unspecified: Secondary | ICD-10-CM | POA: Diagnosis not present

## 2017-05-09 DIAGNOSIS — Z9181 History of falling: Secondary | ICD-10-CM | POA: Diagnosis not present

## 2017-05-09 DIAGNOSIS — I671 Cerebral aneurysm, nonruptured: Secondary | ICD-10-CM | POA: Diagnosis not present

## 2017-05-09 DIAGNOSIS — S82841D Displaced bimalleolar fracture of right lower leg, subsequent encounter for closed fracture with routine healing: Secondary | ICD-10-CM | POA: Diagnosis not present

## 2017-05-09 DIAGNOSIS — M160 Bilateral primary osteoarthritis of hip: Secondary | ICD-10-CM | POA: Diagnosis not present

## 2017-05-09 DIAGNOSIS — W19XXXD Unspecified fall, subsequent encounter: Secondary | ICD-10-CM | POA: Diagnosis not present

## 2017-05-09 DIAGNOSIS — J9611 Chronic respiratory failure with hypoxia: Secondary | ICD-10-CM | POA: Diagnosis not present

## 2017-05-09 DIAGNOSIS — Z9981 Dependence on supplemental oxygen: Secondary | ICD-10-CM | POA: Diagnosis not present

## 2017-05-09 DIAGNOSIS — G20 Parkinson's disease: Secondary | ICD-10-CM | POA: Diagnosis not present

## 2017-05-09 DIAGNOSIS — E119 Type 2 diabetes mellitus without complications: Secondary | ICD-10-CM | POA: Diagnosis not present

## 2017-05-13 DIAGNOSIS — M160 Bilateral primary osteoarthritis of hip: Secondary | ICD-10-CM | POA: Diagnosis not present

## 2017-05-13 DIAGNOSIS — I10 Essential (primary) hypertension: Secondary | ICD-10-CM | POA: Diagnosis not present

## 2017-05-13 DIAGNOSIS — E119 Type 2 diabetes mellitus without complications: Secondary | ICD-10-CM | POA: Diagnosis not present

## 2017-05-13 DIAGNOSIS — G20 Parkinson's disease: Secondary | ICD-10-CM | POA: Diagnosis not present

## 2017-05-13 DIAGNOSIS — S82841D Displaced bimalleolar fracture of right lower leg, subsequent encounter for closed fracture with routine healing: Secondary | ICD-10-CM | POA: Diagnosis not present

## 2017-05-13 DIAGNOSIS — I671 Cerebral aneurysm, nonruptured: Secondary | ICD-10-CM | POA: Diagnosis not present

## 2017-05-13 DIAGNOSIS — Z9981 Dependence on supplemental oxygen: Secondary | ICD-10-CM | POA: Diagnosis not present

## 2017-05-13 DIAGNOSIS — W19XXXD Unspecified fall, subsequent encounter: Secondary | ICD-10-CM | POA: Diagnosis not present

## 2017-05-13 DIAGNOSIS — Z9181 History of falling: Secondary | ICD-10-CM | POA: Diagnosis not present

## 2017-05-13 DIAGNOSIS — J449 Chronic obstructive pulmonary disease, unspecified: Secondary | ICD-10-CM | POA: Diagnosis not present

## 2017-05-13 DIAGNOSIS — J9611 Chronic respiratory failure with hypoxia: Secondary | ICD-10-CM | POA: Diagnosis not present

## 2017-05-14 DIAGNOSIS — G20 Parkinson's disease: Secondary | ICD-10-CM | POA: Diagnosis not present

## 2017-05-14 DIAGNOSIS — E119 Type 2 diabetes mellitus without complications: Secondary | ICD-10-CM | POA: Diagnosis not present

## 2017-05-14 DIAGNOSIS — S82841D Displaced bimalleolar fracture of right lower leg, subsequent encounter for closed fracture with routine healing: Secondary | ICD-10-CM | POA: Diagnosis not present

## 2017-05-14 DIAGNOSIS — W19XXXD Unspecified fall, subsequent encounter: Secondary | ICD-10-CM | POA: Diagnosis not present

## 2017-05-14 DIAGNOSIS — Z9181 History of falling: Secondary | ICD-10-CM | POA: Diagnosis not present

## 2017-05-14 DIAGNOSIS — Z9981 Dependence on supplemental oxygen: Secondary | ICD-10-CM | POA: Diagnosis not present

## 2017-05-14 DIAGNOSIS — J449 Chronic obstructive pulmonary disease, unspecified: Secondary | ICD-10-CM | POA: Diagnosis not present

## 2017-05-14 DIAGNOSIS — I10 Essential (primary) hypertension: Secondary | ICD-10-CM | POA: Diagnosis not present

## 2017-05-14 DIAGNOSIS — J9611 Chronic respiratory failure with hypoxia: Secondary | ICD-10-CM | POA: Diagnosis not present

## 2017-05-14 DIAGNOSIS — I671 Cerebral aneurysm, nonruptured: Secondary | ICD-10-CM | POA: Diagnosis not present

## 2017-05-14 DIAGNOSIS — M160 Bilateral primary osteoarthritis of hip: Secondary | ICD-10-CM | POA: Diagnosis not present

## 2017-05-15 DIAGNOSIS — J9611 Chronic respiratory failure with hypoxia: Secondary | ICD-10-CM | POA: Diagnosis not present

## 2017-05-15 DIAGNOSIS — M160 Bilateral primary osteoarthritis of hip: Secondary | ICD-10-CM | POA: Diagnosis not present

## 2017-05-15 DIAGNOSIS — Z9181 History of falling: Secondary | ICD-10-CM | POA: Diagnosis not present

## 2017-05-15 DIAGNOSIS — W19XXXD Unspecified fall, subsequent encounter: Secondary | ICD-10-CM | POA: Diagnosis not present

## 2017-05-15 DIAGNOSIS — J449 Chronic obstructive pulmonary disease, unspecified: Secondary | ICD-10-CM | POA: Diagnosis not present

## 2017-05-15 DIAGNOSIS — G20 Parkinson's disease: Secondary | ICD-10-CM | POA: Diagnosis not present

## 2017-05-15 DIAGNOSIS — E119 Type 2 diabetes mellitus without complications: Secondary | ICD-10-CM | POA: Diagnosis not present

## 2017-05-15 DIAGNOSIS — Z9981 Dependence on supplemental oxygen: Secondary | ICD-10-CM | POA: Diagnosis not present

## 2017-05-15 DIAGNOSIS — I671 Cerebral aneurysm, nonruptured: Secondary | ICD-10-CM | POA: Diagnosis not present

## 2017-05-15 DIAGNOSIS — I10 Essential (primary) hypertension: Secondary | ICD-10-CM | POA: Diagnosis not present

## 2017-05-15 DIAGNOSIS — S82841D Displaced bimalleolar fracture of right lower leg, subsequent encounter for closed fracture with routine healing: Secondary | ICD-10-CM | POA: Diagnosis not present

## 2017-05-16 DIAGNOSIS — G20 Parkinson's disease: Secondary | ICD-10-CM | POA: Diagnosis not present

## 2017-05-16 DIAGNOSIS — I671 Cerebral aneurysm, nonruptured: Secondary | ICD-10-CM | POA: Diagnosis not present

## 2017-05-16 DIAGNOSIS — M160 Bilateral primary osteoarthritis of hip: Secondary | ICD-10-CM | POA: Diagnosis not present

## 2017-05-16 DIAGNOSIS — J9611 Chronic respiratory failure with hypoxia: Secondary | ICD-10-CM | POA: Diagnosis not present

## 2017-05-16 DIAGNOSIS — W19XXXD Unspecified fall, subsequent encounter: Secondary | ICD-10-CM | POA: Diagnosis not present

## 2017-05-16 DIAGNOSIS — Z9181 History of falling: Secondary | ICD-10-CM | POA: Diagnosis not present

## 2017-05-16 DIAGNOSIS — I10 Essential (primary) hypertension: Secondary | ICD-10-CM | POA: Diagnosis not present

## 2017-05-16 DIAGNOSIS — J449 Chronic obstructive pulmonary disease, unspecified: Secondary | ICD-10-CM | POA: Diagnosis not present

## 2017-05-16 DIAGNOSIS — E119 Type 2 diabetes mellitus without complications: Secondary | ICD-10-CM | POA: Diagnosis not present

## 2017-05-16 DIAGNOSIS — Z9981 Dependence on supplemental oxygen: Secondary | ICD-10-CM | POA: Diagnosis not present

## 2017-05-16 DIAGNOSIS — S82841D Displaced bimalleolar fracture of right lower leg, subsequent encounter for closed fracture with routine healing: Secondary | ICD-10-CM | POA: Diagnosis not present

## 2017-05-17 DIAGNOSIS — Z4789 Encounter for other orthopedic aftercare: Secondary | ICD-10-CM | POA: Diagnosis not present

## 2017-05-17 DIAGNOSIS — W19XXXD Unspecified fall, subsequent encounter: Secondary | ICD-10-CM | POA: Diagnosis not present

## 2017-05-17 DIAGNOSIS — S82841D Displaced bimalleolar fracture of right lower leg, subsequent encounter for closed fracture with routine healing: Secondary | ICD-10-CM | POA: Diagnosis not present

## 2017-05-18 DIAGNOSIS — Z4789 Encounter for other orthopedic aftercare: Secondary | ICD-10-CM | POA: Diagnosis not present

## 2017-05-19 DIAGNOSIS — S82891D Other fracture of right lower leg, subsequent encounter for closed fracture with routine healing: Secondary | ICD-10-CM | POA: Diagnosis not present

## 2017-05-20 DIAGNOSIS — Z9181 History of falling: Secondary | ICD-10-CM | POA: Diagnosis not present

## 2017-05-20 DIAGNOSIS — G20 Parkinson's disease: Secondary | ICD-10-CM | POA: Diagnosis not present

## 2017-05-20 DIAGNOSIS — J449 Chronic obstructive pulmonary disease, unspecified: Secondary | ICD-10-CM | POA: Diagnosis not present

## 2017-05-20 DIAGNOSIS — W19XXXD Unspecified fall, subsequent encounter: Secondary | ICD-10-CM | POA: Diagnosis not present

## 2017-05-20 DIAGNOSIS — I10 Essential (primary) hypertension: Secondary | ICD-10-CM | POA: Diagnosis not present

## 2017-05-20 DIAGNOSIS — M160 Bilateral primary osteoarthritis of hip: Secondary | ICD-10-CM | POA: Diagnosis not present

## 2017-05-20 DIAGNOSIS — J9611 Chronic respiratory failure with hypoxia: Secondary | ICD-10-CM | POA: Diagnosis not present

## 2017-05-20 DIAGNOSIS — Z9981 Dependence on supplemental oxygen: Secondary | ICD-10-CM | POA: Diagnosis not present

## 2017-05-20 DIAGNOSIS — E119 Type 2 diabetes mellitus without complications: Secondary | ICD-10-CM | POA: Diagnosis not present

## 2017-05-20 DIAGNOSIS — I671 Cerebral aneurysm, nonruptured: Secondary | ICD-10-CM | POA: Diagnosis not present

## 2017-05-20 DIAGNOSIS — S82841D Displaced bimalleolar fracture of right lower leg, subsequent encounter for closed fracture with routine healing: Secondary | ICD-10-CM | POA: Diagnosis not present

## 2017-05-21 DIAGNOSIS — G20 Parkinson's disease: Secondary | ICD-10-CM | POA: Diagnosis not present

## 2017-05-21 DIAGNOSIS — W19XXXD Unspecified fall, subsequent encounter: Secondary | ICD-10-CM | POA: Diagnosis not present

## 2017-05-21 DIAGNOSIS — I10 Essential (primary) hypertension: Secondary | ICD-10-CM | POA: Diagnosis not present

## 2017-05-21 DIAGNOSIS — S82841D Displaced bimalleolar fracture of right lower leg, subsequent encounter for closed fracture with routine healing: Secondary | ICD-10-CM | POA: Diagnosis not present

## 2017-05-21 DIAGNOSIS — Z9181 History of falling: Secondary | ICD-10-CM | POA: Diagnosis not present

## 2017-05-21 DIAGNOSIS — J449 Chronic obstructive pulmonary disease, unspecified: Secondary | ICD-10-CM | POA: Diagnosis not present

## 2017-05-21 DIAGNOSIS — I671 Cerebral aneurysm, nonruptured: Secondary | ICD-10-CM | POA: Diagnosis not present

## 2017-05-21 DIAGNOSIS — M160 Bilateral primary osteoarthritis of hip: Secondary | ICD-10-CM | POA: Diagnosis not present

## 2017-05-21 DIAGNOSIS — J9611 Chronic respiratory failure with hypoxia: Secondary | ICD-10-CM | POA: Diagnosis not present

## 2017-05-21 DIAGNOSIS — Z9981 Dependence on supplemental oxygen: Secondary | ICD-10-CM | POA: Diagnosis not present

## 2017-05-21 DIAGNOSIS — E119 Type 2 diabetes mellitus without complications: Secondary | ICD-10-CM | POA: Diagnosis not present

## 2017-05-22 DIAGNOSIS — I671 Cerebral aneurysm, nonruptured: Secondary | ICD-10-CM | POA: Diagnosis not present

## 2017-05-22 DIAGNOSIS — M160 Bilateral primary osteoarthritis of hip: Secondary | ICD-10-CM | POA: Diagnosis not present

## 2017-05-22 DIAGNOSIS — G20 Parkinson's disease: Secondary | ICD-10-CM | POA: Diagnosis not present

## 2017-05-22 DIAGNOSIS — J449 Chronic obstructive pulmonary disease, unspecified: Secondary | ICD-10-CM | POA: Diagnosis not present

## 2017-05-22 DIAGNOSIS — W19XXXD Unspecified fall, subsequent encounter: Secondary | ICD-10-CM | POA: Diagnosis not present

## 2017-05-22 DIAGNOSIS — E119 Type 2 diabetes mellitus without complications: Secondary | ICD-10-CM | POA: Diagnosis not present

## 2017-05-22 DIAGNOSIS — Z9981 Dependence on supplemental oxygen: Secondary | ICD-10-CM | POA: Diagnosis not present

## 2017-05-22 DIAGNOSIS — S82841D Displaced bimalleolar fracture of right lower leg, subsequent encounter for closed fracture with routine healing: Secondary | ICD-10-CM | POA: Diagnosis not present

## 2017-05-22 DIAGNOSIS — Z9181 History of falling: Secondary | ICD-10-CM | POA: Diagnosis not present

## 2017-05-22 DIAGNOSIS — I10 Essential (primary) hypertension: Secondary | ICD-10-CM | POA: Diagnosis not present

## 2017-05-22 DIAGNOSIS — J9611 Chronic respiratory failure with hypoxia: Secondary | ICD-10-CM | POA: Diagnosis not present

## 2017-05-23 DIAGNOSIS — S82841D Displaced bimalleolar fracture of right lower leg, subsequent encounter for closed fracture with routine healing: Secondary | ICD-10-CM | POA: Diagnosis not present

## 2017-05-23 DIAGNOSIS — W19XXXD Unspecified fall, subsequent encounter: Secondary | ICD-10-CM | POA: Diagnosis not present

## 2017-05-23 DIAGNOSIS — I10 Essential (primary) hypertension: Secondary | ICD-10-CM | POA: Diagnosis not present

## 2017-05-23 DIAGNOSIS — E119 Type 2 diabetes mellitus without complications: Secondary | ICD-10-CM | POA: Diagnosis not present

## 2017-05-23 DIAGNOSIS — Z9181 History of falling: Secondary | ICD-10-CM | POA: Diagnosis not present

## 2017-05-23 DIAGNOSIS — J449 Chronic obstructive pulmonary disease, unspecified: Secondary | ICD-10-CM | POA: Diagnosis not present

## 2017-05-23 DIAGNOSIS — J9611 Chronic respiratory failure with hypoxia: Secondary | ICD-10-CM | POA: Diagnosis not present

## 2017-05-23 DIAGNOSIS — I671 Cerebral aneurysm, nonruptured: Secondary | ICD-10-CM | POA: Diagnosis not present

## 2017-05-23 DIAGNOSIS — G20 Parkinson's disease: Secondary | ICD-10-CM | POA: Diagnosis not present

## 2017-05-23 DIAGNOSIS — M160 Bilateral primary osteoarthritis of hip: Secondary | ICD-10-CM | POA: Diagnosis not present

## 2017-05-23 DIAGNOSIS — Z9981 Dependence on supplemental oxygen: Secondary | ICD-10-CM | POA: Diagnosis not present

## 2017-05-28 DIAGNOSIS — E119 Type 2 diabetes mellitus without complications: Secondary | ICD-10-CM | POA: Diagnosis not present

## 2017-05-28 DIAGNOSIS — Z9981 Dependence on supplemental oxygen: Secondary | ICD-10-CM | POA: Diagnosis not present

## 2017-05-28 DIAGNOSIS — Z9181 History of falling: Secondary | ICD-10-CM | POA: Diagnosis not present

## 2017-05-28 DIAGNOSIS — J9611 Chronic respiratory failure with hypoxia: Secondary | ICD-10-CM | POA: Diagnosis not present

## 2017-05-28 DIAGNOSIS — G20 Parkinson's disease: Secondary | ICD-10-CM | POA: Diagnosis not present

## 2017-05-28 DIAGNOSIS — M160 Bilateral primary osteoarthritis of hip: Secondary | ICD-10-CM | POA: Diagnosis not present

## 2017-05-28 DIAGNOSIS — I10 Essential (primary) hypertension: Secondary | ICD-10-CM | POA: Diagnosis not present

## 2017-05-28 DIAGNOSIS — I671 Cerebral aneurysm, nonruptured: Secondary | ICD-10-CM | POA: Diagnosis not present

## 2017-05-28 DIAGNOSIS — W19XXXD Unspecified fall, subsequent encounter: Secondary | ICD-10-CM | POA: Diagnosis not present

## 2017-05-28 DIAGNOSIS — S82841D Displaced bimalleolar fracture of right lower leg, subsequent encounter for closed fracture with routine healing: Secondary | ICD-10-CM | POA: Diagnosis not present

## 2017-05-28 DIAGNOSIS — J449 Chronic obstructive pulmonary disease, unspecified: Secondary | ICD-10-CM | POA: Diagnosis not present

## 2017-05-29 DIAGNOSIS — J9611 Chronic respiratory failure with hypoxia: Secondary | ICD-10-CM | POA: Diagnosis not present

## 2017-05-29 DIAGNOSIS — G20 Parkinson's disease: Secondary | ICD-10-CM | POA: Diagnosis not present

## 2017-05-29 DIAGNOSIS — Z9181 History of falling: Secondary | ICD-10-CM | POA: Diagnosis not present

## 2017-05-29 DIAGNOSIS — I671 Cerebral aneurysm, nonruptured: Secondary | ICD-10-CM | POA: Diagnosis not present

## 2017-05-29 DIAGNOSIS — I10 Essential (primary) hypertension: Secondary | ICD-10-CM | POA: Diagnosis not present

## 2017-05-29 DIAGNOSIS — Z9981 Dependence on supplemental oxygen: Secondary | ICD-10-CM | POA: Diagnosis not present

## 2017-05-29 DIAGNOSIS — E119 Type 2 diabetes mellitus without complications: Secondary | ICD-10-CM | POA: Diagnosis not present

## 2017-05-29 DIAGNOSIS — W19XXXD Unspecified fall, subsequent encounter: Secondary | ICD-10-CM | POA: Diagnosis not present

## 2017-05-29 DIAGNOSIS — J449 Chronic obstructive pulmonary disease, unspecified: Secondary | ICD-10-CM | POA: Diagnosis not present

## 2017-05-29 DIAGNOSIS — S82841D Displaced bimalleolar fracture of right lower leg, subsequent encounter for closed fracture with routine healing: Secondary | ICD-10-CM | POA: Diagnosis not present

## 2017-05-29 DIAGNOSIS — M160 Bilateral primary osteoarthritis of hip: Secondary | ICD-10-CM | POA: Diagnosis not present

## 2017-05-30 DIAGNOSIS — I671 Cerebral aneurysm, nonruptured: Secondary | ICD-10-CM | POA: Diagnosis not present

## 2017-05-30 DIAGNOSIS — M160 Bilateral primary osteoarthritis of hip: Secondary | ICD-10-CM | POA: Diagnosis not present

## 2017-05-30 DIAGNOSIS — Z9981 Dependence on supplemental oxygen: Secondary | ICD-10-CM | POA: Diagnosis not present

## 2017-05-30 DIAGNOSIS — G20 Parkinson's disease: Secondary | ICD-10-CM | POA: Diagnosis not present

## 2017-05-30 DIAGNOSIS — S82841D Displaced bimalleolar fracture of right lower leg, subsequent encounter for closed fracture with routine healing: Secondary | ICD-10-CM | POA: Diagnosis not present

## 2017-05-30 DIAGNOSIS — E119 Type 2 diabetes mellitus without complications: Secondary | ICD-10-CM | POA: Diagnosis not present

## 2017-05-30 DIAGNOSIS — W19XXXD Unspecified fall, subsequent encounter: Secondary | ICD-10-CM | POA: Diagnosis not present

## 2017-05-30 DIAGNOSIS — Z9181 History of falling: Secondary | ICD-10-CM | POA: Diagnosis not present

## 2017-05-30 DIAGNOSIS — I10 Essential (primary) hypertension: Secondary | ICD-10-CM | POA: Diagnosis not present

## 2017-05-30 DIAGNOSIS — J9611 Chronic respiratory failure with hypoxia: Secondary | ICD-10-CM | POA: Diagnosis not present

## 2017-05-30 DIAGNOSIS — J449 Chronic obstructive pulmonary disease, unspecified: Secondary | ICD-10-CM | POA: Diagnosis not present

## 2017-06-08 DIAGNOSIS — J449 Chronic obstructive pulmonary disease, unspecified: Secondary | ICD-10-CM | POA: Diagnosis not present

## 2017-06-10 DIAGNOSIS — M160 Bilateral primary osteoarthritis of hip: Secondary | ICD-10-CM | POA: Diagnosis not present

## 2017-06-10 DIAGNOSIS — E119 Type 2 diabetes mellitus without complications: Secondary | ICD-10-CM | POA: Diagnosis not present

## 2017-06-10 DIAGNOSIS — Z9981 Dependence on supplemental oxygen: Secondary | ICD-10-CM | POA: Diagnosis not present

## 2017-06-10 DIAGNOSIS — I10 Essential (primary) hypertension: Secondary | ICD-10-CM | POA: Diagnosis not present

## 2017-06-10 DIAGNOSIS — G20 Parkinson's disease: Secondary | ICD-10-CM | POA: Diagnosis not present

## 2017-06-10 DIAGNOSIS — J9611 Chronic respiratory failure with hypoxia: Secondary | ICD-10-CM | POA: Diagnosis not present

## 2017-06-10 DIAGNOSIS — S82841D Displaced bimalleolar fracture of right lower leg, subsequent encounter for closed fracture with routine healing: Secondary | ICD-10-CM | POA: Diagnosis not present

## 2017-06-10 DIAGNOSIS — J449 Chronic obstructive pulmonary disease, unspecified: Secondary | ICD-10-CM | POA: Diagnosis not present

## 2017-06-10 DIAGNOSIS — W19XXXD Unspecified fall, subsequent encounter: Secondary | ICD-10-CM | POA: Diagnosis not present

## 2017-06-10 DIAGNOSIS — Z9181 History of falling: Secondary | ICD-10-CM | POA: Diagnosis not present

## 2017-06-10 DIAGNOSIS — I671 Cerebral aneurysm, nonruptured: Secondary | ICD-10-CM | POA: Diagnosis not present

## 2017-06-12 DIAGNOSIS — E119 Type 2 diabetes mellitus without complications: Secondary | ICD-10-CM | POA: Diagnosis not present

## 2017-06-12 DIAGNOSIS — W19XXXD Unspecified fall, subsequent encounter: Secondary | ICD-10-CM | POA: Diagnosis not present

## 2017-06-12 DIAGNOSIS — G20 Parkinson's disease: Secondary | ICD-10-CM | POA: Diagnosis not present

## 2017-06-12 DIAGNOSIS — I671 Cerebral aneurysm, nonruptured: Secondary | ICD-10-CM | POA: Diagnosis not present

## 2017-06-12 DIAGNOSIS — J9611 Chronic respiratory failure with hypoxia: Secondary | ICD-10-CM | POA: Diagnosis not present

## 2017-06-12 DIAGNOSIS — I10 Essential (primary) hypertension: Secondary | ICD-10-CM | POA: Diagnosis not present

## 2017-06-12 DIAGNOSIS — M160 Bilateral primary osteoarthritis of hip: Secondary | ICD-10-CM | POA: Diagnosis not present

## 2017-06-12 DIAGNOSIS — S82841D Displaced bimalleolar fracture of right lower leg, subsequent encounter for closed fracture with routine healing: Secondary | ICD-10-CM | POA: Diagnosis not present

## 2017-06-12 DIAGNOSIS — J449 Chronic obstructive pulmonary disease, unspecified: Secondary | ICD-10-CM | POA: Diagnosis not present

## 2017-06-12 DIAGNOSIS — Z9981 Dependence on supplemental oxygen: Secondary | ICD-10-CM | POA: Diagnosis not present

## 2017-06-12 DIAGNOSIS — Z9181 History of falling: Secondary | ICD-10-CM | POA: Diagnosis not present

## 2017-06-16 DIAGNOSIS — S82841D Displaced bimalleolar fracture of right lower leg, subsequent encounter for closed fracture with routine healing: Secondary | ICD-10-CM | POA: Diagnosis not present

## 2017-06-16 DIAGNOSIS — Z967 Presence of other bone and tendon implants: Secondary | ICD-10-CM | POA: Diagnosis not present

## 2017-06-17 DIAGNOSIS — S82841D Displaced bimalleolar fracture of right lower leg, subsequent encounter for closed fracture with routine healing: Secondary | ICD-10-CM | POA: Diagnosis not present

## 2017-06-17 DIAGNOSIS — Z4789 Encounter for other orthopedic aftercare: Secondary | ICD-10-CM | POA: Diagnosis not present

## 2017-06-17 DIAGNOSIS — W19XXXD Unspecified fall, subsequent encounter: Secondary | ICD-10-CM | POA: Diagnosis not present

## 2017-06-18 DIAGNOSIS — Z4789 Encounter for other orthopedic aftercare: Secondary | ICD-10-CM | POA: Diagnosis not present

## 2017-07-06 DIAGNOSIS — J449 Chronic obstructive pulmonary disease, unspecified: Secondary | ICD-10-CM | POA: Diagnosis not present

## 2017-07-15 DIAGNOSIS — W19XXXD Unspecified fall, subsequent encounter: Secondary | ICD-10-CM | POA: Diagnosis not present

## 2017-07-15 DIAGNOSIS — S82841D Displaced bimalleolar fracture of right lower leg, subsequent encounter for closed fracture with routine healing: Secondary | ICD-10-CM | POA: Diagnosis not present

## 2017-07-15 DIAGNOSIS — Z4789 Encounter for other orthopedic aftercare: Secondary | ICD-10-CM | POA: Diagnosis not present

## 2017-07-16 DIAGNOSIS — Z4789 Encounter for other orthopedic aftercare: Secondary | ICD-10-CM | POA: Diagnosis not present

## 2017-07-25 DIAGNOSIS — R5383 Other fatigue: Secondary | ICD-10-CM | POA: Diagnosis not present

## 2017-07-25 DIAGNOSIS — G4733 Obstructive sleep apnea (adult) (pediatric): Secondary | ICD-10-CM | POA: Diagnosis not present

## 2017-07-25 DIAGNOSIS — J4550 Severe persistent asthma, uncomplicated: Secondary | ICD-10-CM | POA: Diagnosis not present

## 2017-07-29 DIAGNOSIS — I6381 Other cerebral infarction due to occlusion or stenosis of small artery: Secondary | ICD-10-CM | POA: Diagnosis not present

## 2017-07-29 DIAGNOSIS — J449 Chronic obstructive pulmonary disease, unspecified: Secondary | ICD-10-CM | POA: Diagnosis not present

## 2017-07-29 DIAGNOSIS — Z01810 Encounter for preprocedural cardiovascular examination: Secondary | ICD-10-CM | POA: Diagnosis not present

## 2017-07-29 DIAGNOSIS — I10 Essential (primary) hypertension: Secondary | ICD-10-CM | POA: Diagnosis not present

## 2017-07-29 DIAGNOSIS — R001 Bradycardia, unspecified: Secondary | ICD-10-CM | POA: Diagnosis not present

## 2017-07-29 DIAGNOSIS — I6389 Other cerebral infarction: Secondary | ICD-10-CM | POA: Diagnosis not present

## 2017-08-06 DIAGNOSIS — J449 Chronic obstructive pulmonary disease, unspecified: Secondary | ICD-10-CM | POA: Diagnosis not present

## 2017-08-15 DIAGNOSIS — S82841D Displaced bimalleolar fracture of right lower leg, subsequent encounter for closed fracture with routine healing: Secondary | ICD-10-CM | POA: Diagnosis not present

## 2017-08-15 DIAGNOSIS — Z4789 Encounter for other orthopedic aftercare: Secondary | ICD-10-CM | POA: Diagnosis not present

## 2017-08-15 DIAGNOSIS — W19XXXD Unspecified fall, subsequent encounter: Secondary | ICD-10-CM | POA: Diagnosis not present

## 2017-08-16 DIAGNOSIS — Z4789 Encounter for other orthopedic aftercare: Secondary | ICD-10-CM | POA: Diagnosis not present

## 2017-09-02 DIAGNOSIS — E785 Hyperlipidemia, unspecified: Secondary | ICD-10-CM | POA: Diagnosis not present

## 2017-09-02 DIAGNOSIS — E559 Vitamin D deficiency, unspecified: Secondary | ICD-10-CM | POA: Diagnosis not present

## 2017-09-02 DIAGNOSIS — Z79891 Long term (current) use of opiate analgesic: Secondary | ICD-10-CM | POA: Diagnosis not present

## 2017-09-02 DIAGNOSIS — J449 Chronic obstructive pulmonary disease, unspecified: Secondary | ICD-10-CM | POA: Diagnosis not present

## 2017-09-02 DIAGNOSIS — I10 Essential (primary) hypertension: Secondary | ICD-10-CM | POA: Diagnosis not present

## 2017-09-02 DIAGNOSIS — Z1331 Encounter for screening for depression: Secondary | ICD-10-CM | POA: Diagnosis not present

## 2017-09-02 DIAGNOSIS — E1142 Type 2 diabetes mellitus with diabetic polyneuropathy: Secondary | ICD-10-CM | POA: Diagnosis not present

## 2017-09-02 DIAGNOSIS — Z0000 Encounter for general adult medical examination without abnormal findings: Secondary | ICD-10-CM | POA: Diagnosis not present

## 2017-09-02 DIAGNOSIS — Z9181 History of falling: Secondary | ICD-10-CM | POA: Diagnosis not present

## 2017-09-05 DIAGNOSIS — J449 Chronic obstructive pulmonary disease, unspecified: Secondary | ICD-10-CM | POA: Diagnosis not present

## 2017-09-08 DIAGNOSIS — S82891D Other fracture of right lower leg, subsequent encounter for closed fracture with routine healing: Secondary | ICD-10-CM | POA: Diagnosis not present

## 2017-09-14 DIAGNOSIS — Z4789 Encounter for other orthopedic aftercare: Secondary | ICD-10-CM | POA: Diagnosis not present

## 2017-09-14 DIAGNOSIS — W19XXXD Unspecified fall, subsequent encounter: Secondary | ICD-10-CM | POA: Diagnosis not present

## 2017-09-14 DIAGNOSIS — S82841D Displaced bimalleolar fracture of right lower leg, subsequent encounter for closed fracture with routine healing: Secondary | ICD-10-CM | POA: Diagnosis not present

## 2017-09-15 DIAGNOSIS — Z4789 Encounter for other orthopedic aftercare: Secondary | ICD-10-CM | POA: Diagnosis not present

## 2017-10-06 DIAGNOSIS — J449 Chronic obstructive pulmonary disease, unspecified: Secondary | ICD-10-CM | POA: Diagnosis not present

## 2017-10-10 DIAGNOSIS — M8589 Other specified disorders of bone density and structure, multiple sites: Secondary | ICD-10-CM | POA: Diagnosis not present

## 2017-10-10 DIAGNOSIS — Z1231 Encounter for screening mammogram for malignant neoplasm of breast: Secondary | ICD-10-CM | POA: Diagnosis not present

## 2017-10-14 DIAGNOSIS — S81852A Open bite, left lower leg, initial encounter: Secondary | ICD-10-CM | POA: Diagnosis not present

## 2017-10-14 DIAGNOSIS — S51852A Open bite of left forearm, initial encounter: Secondary | ICD-10-CM | POA: Diagnosis not present

## 2017-10-14 DIAGNOSIS — Z23 Encounter for immunization: Secondary | ICD-10-CM | POA: Diagnosis not present

## 2017-10-14 DIAGNOSIS — S51851A Open bite of right forearm, initial encounter: Secondary | ICD-10-CM | POA: Diagnosis not present

## 2017-10-15 DIAGNOSIS — Z4789 Encounter for other orthopedic aftercare: Secondary | ICD-10-CM | POA: Diagnosis not present

## 2017-10-15 DIAGNOSIS — W19XXXD Unspecified fall, subsequent encounter: Secondary | ICD-10-CM | POA: Diagnosis not present

## 2017-10-15 DIAGNOSIS — S82841D Displaced bimalleolar fracture of right lower leg, subsequent encounter for closed fracture with routine healing: Secondary | ICD-10-CM | POA: Diagnosis not present

## 2017-10-16 DIAGNOSIS — Z4789 Encounter for other orthopedic aftercare: Secondary | ICD-10-CM | POA: Diagnosis not present

## 2017-10-16 DIAGNOSIS — C50919 Malignant neoplasm of unspecified site of unspecified female breast: Secondary | ICD-10-CM | POA: Insufficient documentation

## 2017-10-30 NOTE — Telephone Encounter (Signed)
Signing past encounter of attempted phone call 

## 2017-11-05 DIAGNOSIS — J449 Chronic obstructive pulmonary disease, unspecified: Secondary | ICD-10-CM | POA: Diagnosis not present

## 2017-11-10 DIAGNOSIS — N6322 Unspecified lump in the left breast, upper inner quadrant: Secondary | ICD-10-CM | POA: Diagnosis not present

## 2017-11-10 DIAGNOSIS — N6320 Unspecified lump in the left breast, unspecified quadrant: Secondary | ICD-10-CM | POA: Diagnosis not present

## 2017-11-10 DIAGNOSIS — R921 Mammographic calcification found on diagnostic imaging of breast: Secondary | ICD-10-CM | POA: Diagnosis not present

## 2017-11-10 DIAGNOSIS — N6324 Unspecified lump in the left breast, lower inner quadrant: Secondary | ICD-10-CM | POA: Diagnosis not present

## 2017-11-14 DIAGNOSIS — S82841D Displaced bimalleolar fracture of right lower leg, subsequent encounter for closed fracture with routine healing: Secondary | ICD-10-CM | POA: Diagnosis not present

## 2017-11-14 DIAGNOSIS — W19XXXD Unspecified fall, subsequent encounter: Secondary | ICD-10-CM | POA: Diagnosis not present

## 2017-11-14 DIAGNOSIS — Z4789 Encounter for other orthopedic aftercare: Secondary | ICD-10-CM | POA: Diagnosis not present

## 2017-11-15 DIAGNOSIS — Z4789 Encounter for other orthopedic aftercare: Secondary | ICD-10-CM | POA: Diagnosis not present

## 2017-11-18 DIAGNOSIS — N6322 Unspecified lump in the left breast, upper inner quadrant: Secondary | ICD-10-CM | POA: Diagnosis not present

## 2017-11-19 DIAGNOSIS — R928 Other abnormal and inconclusive findings on diagnostic imaging of breast: Secondary | ICD-10-CM | POA: Diagnosis not present

## 2017-11-19 DIAGNOSIS — N6322 Unspecified lump in the left breast, upper inner quadrant: Secondary | ICD-10-CM | POA: Diagnosis not present

## 2017-11-19 DIAGNOSIS — C50212 Malignant neoplasm of upper-inner quadrant of left female breast: Secondary | ICD-10-CM | POA: Diagnosis not present

## 2017-11-19 DIAGNOSIS — N6022 Fibroadenosis of left breast: Secondary | ICD-10-CM | POA: Diagnosis not present

## 2017-11-21 ENCOUNTER — Ambulatory Visit (HOSPITAL_COMMUNITY)
Admission: RE | Admit: 2017-11-21 | Discharge: 2017-11-21 | Disposition: A | Discharge status: Home / Self Care | Payer: Medicare Other | Source: Ambulatory Visit | Attending: Interventional Radiology | Admitting: Interventional Radiology

## 2017-11-21 DIAGNOSIS — I720 Aneurysm of carotid artery: Secondary | ICD-10-CM | POA: Diagnosis not present

## 2017-11-21 DIAGNOSIS — I771 Stricture of artery: Secondary | ICD-10-CM

## 2017-11-21 DIAGNOSIS — I729 Aneurysm of unspecified site: Secondary | ICD-10-CM

## 2017-11-21 NOTE — Consult Note (Signed)
Chief Complaint: Patient was seen in consultation today for left ICA cavernous segment aneurysm.  Referring Physician(s): None  Supervising Physician: Luanne Bras  Patient Status: [facility] - Out-pt  History of Present Illness: Nicole May is a 77 y.o. female with a past medical history of hypertension, COPD, nephrolithiasis, pre-diabetes, and anxiety. She is known to [facility] and has been followed by Dr. Estanislado Pandy since 11/2015. She was last seen by Dr. Estanislado Pandy 02/27/2017 when she underwent an image-guided diagnostic cerebral angiogram.  Diagnostic cerebral angiogram 02/27/2017: 1. Large left internal carotid artery proximal cavernous segment fusiform aneurysm with eccentric saccular component along the inferior wall of the vessel. This measures approximately 9 mm x 8.7 mm compared to approximately 9 mm x 4.5 mm on the arteriogram of 01/02/2016. 2. 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 3. 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  Patient presents today to discuss her recent diagnostic cerebral angiogram 02/27/2017. Patient awake and alert sitting in wheelchair. Accompanied by son, daughter-in-law, and granddaughter. Complains of right eye diplopia for the past few months. Denies headache, weakness, numbness/tingling, dizziness, blurred vision, hearing changes, tinnitus, or speech difficulty.  Patient is currently taking Aspirin 325 mg once daily.   Past Medical History:  Diagnosis Date  . Anxiety   . COPD (chronic obstructive pulmonary disease) ([place])   . Hypertension   . Kidney stone on right side 2017  . Pre-diabetes     Past Surgical History:  Procedure Laterality Date  . EYE SURGERY    . HERNIA REPAIR    . IR ANGIO INTRA EXTRACRAN SEL COM CAROTID INNOMINATE BILAT MOD SED   02/27/2017  . IR ANGIO VERTEBRAL SEL VERTEBRAL BILAT MOD SED  02/27/2017  . IR GENERIC HISTORICAL  12/19/2015   IR RADIOLOGIST EVAL & MGMT 12/19/2015 [facility]  . IR GENERIC HISTORICAL  01/02/2016   IR ANGIO VERTEBRAL SEL SUBCLAVIAN INNOMINATE UNI R MOD SED 01/02/2016 Luanne Bras, MD [facility]  . IR GENERIC HISTORICAL  01/02/2016   IR ANGIO VERTEBRAL SEL VERTEBRAL UNI L MOD SED 01/02/2016 Luanne Bras, MD [facility]  . IR GENERIC HISTORICAL  01/02/2016   IR ANGIO INTRA EXTRACRAN SEL COM CAROTID INNOMINATE BILAT MOD SED 01/02/2016 Luanne Bras, MD [facility]    Allergies: Patient has no known allergies.  Medications: Prior to Admission medications   Medication Sig Start Date End Date Taking? Authorizing Provider  albuterol (PROVENTIL HFA;VENTOLIN HFA) 108 (90 Base) MCG/ACT inhaler Inhale 2 puffs into the lungs every 4 (four) hours as needed for wheezing or shortness of breath.    [provider]  ALPRAZolam Duanne Moron) 1 MG tablet Take 1 mg by mouth 2 (two) times daily.     [provider]  aspirin 325 MG tablet Take 325 mg by mouth daily as needed for headache.    [provider]  Cholecalciferol (VITAMIN D3) 5000 units TABS Take 1 tablet by mouth daily.    [provider]  DiphenhydrAMINE HCl, Sleep, (UNISOM SLEEPGELS) 50 MG CAPS Take 1 capsule by mouth at bedtime.    [provider]  escitalopram (LEXAPRO) 20 MG tablet Take 20 mg by mouth daily.    [provider]  furosemide (LASIX) 40 MG tablet Take 40 mg by mouth daily as needed for fluid.    [provider]  gabapentin (NEURONTIN) 300 MG capsule  Take 300 mg by mouth 2 (two) times daily.     [provider]  hydrALAZINE (APRESOLINE) 25 MG tablet Take 25 mg by mouth 2 (two) times daily.    [provider]  latanoprost (XALATAN) 0.005 % ophthalmic solution Place 1 drop into both eyes at bedtime.    [provider]  lisinopril  (PRINIVIL,ZESTRIL) 40 MG tablet Take 40 mg by mouth daily.    [provider]  metoprolol succinate (TOPROL-XL) 25 MG 24 hr tablet Take 25 mg by mouth every morning.    [provider]  Multiple Vitamins-Minerals (HAIR SKIN AND NAILS FORMULA PO) Take 1 tablet by mouth daily.    [provider]  Multiple Vitamins-Minerals (WOMENS MULTIVITAMIN) TABS Take 1 tablet by mouth daily.    [provider]  nystatin cream (MYCOSTATIN) Apply 1 application topically 2 (two) times daily as needed for dry skin.    [provider]  ondansetron (ZOFRAN) 4 MG tablet Take 4 mg by mouth every 8 (eight) hours as needed for nausea or vomiting.    [provider]  OXYGEN Inhale 2 L into the lungs continuous.    [provider]  rOPINIRole (REQUIP) 0.5 MG tablet Take 0.5 mg by mouth at bedtime as needed (Leg pain).    [provider]     Family History  Problem Relation Age of Onset  . Cancer - Lung Mother   . Kidney disease Father     Social History   Socioeconomic History  . Marital status: Widowed    Spouse name: Not on file  . Number of children: Not on file  . Years of education: Not on file  . Highest education level: Not on file  Occupational History  . Not on file  Social Needs  . Financial resource strain: Not on file  . Food insecurity:    Worry: Not on file    Inability: Not on file  . Transportation needs:    Medical: Not on file    Non-medical: Not on file  Tobacco Use  . Smoking status: Current Every Day Smoker    Packs/day: 1.00    Years: 15.00    Pack years: 15.00    Types: Cigarettes  . Smokeless tobacco: Never Used  Substance and Sexual Activity  . Alcohol use: Not on file  . Drug use: Not on file  . Sexual activity: Not on file  Lifestyle  . Physical activity:    Days per week: Not on file    Minutes per session: Not on file  . Stress: Not on file  Relationships  . Social connections:    Talks on  phone: Not on file    Gets together: Not on file    Attends religious service: Not on file    Active member of club or organization: Not on file    Attends meetings of clubs or organizations: Not on file    Relationship status: Not on file  Other Topics Concern  . Not on file  Social History Narrative  . Not on file     Review of Systems: A 12 point ROS discussed and pertinent positives are indicated in the HPI above.  All other systems are negative.  Review of Systems  Constitutional: Negative for chills and fever.  HENT: Negative for hearing loss and tinnitus.   Eyes: Positive for visual disturbance.  Respiratory: Negative for shortness of breath and wheezing.   Cardiovascular: Negative for chest pain and palpitations.  Neurological: Negative for dizziness, speech difficulty, weakness, numbness and headaches.  Psychiatric/Behavioral: Negative for behavioral problems and confusion.    Vital Signs: There were no vitals taken for this visit.  Physical Exam  Constitutional: She is oriented to person, place, and time. She appears well-developed and well-nourished. No distress.  Pulmonary/Chest: Effort normal. No respiratory distress.  Neurological: She is alert and oriented to person, place, and time.  Skin: Skin is warm and dry.  Psychiatric: She has a normal mood and affect. Her behavior is normal. Judgment and thought content normal.  Nursing note and vitals reviewed.    Imaging: No results found.  Labs:  CBC: Recent Labs    02/27/17 0851  WBC 4.7  HGB 14.4  HCT 46.1*  PLT 137*    COAGS: Recent Labs    02/27/17 0851  INR 0.95  APTT 30    BMP: Recent Labs    01/08/17 1618 02/27/17 0851  NA  --  142  K  --  4.6  CL  --  105  CO2  --  28  GLUCOSE  --  106*  BUN  --  16  CALCIUM  --  9.2  CREATININE 1.10* 1.02*  GFRNONAA 48* 53*  GFRAA 56* >60    LIVER FUNCTION TESTS: No results for input(s): BILITOT, AST, ALT, ALKPHOS, PROT, ALBUMIN in the  last 8760 hours.  TUMOR MARKERS: No results for input(s): AFPTM, CEA, CA199, CHROMGRNA in the last 8760 hours.  Assessment and Plan:  Left ICA cavernous segment aneurysm. Reviewed imaging with patient and family. Discussed the nature of aneurysms, including risk of rupture. Explained that there are two management options moving forward- either monitoring with routine image scans or with a cerebral angiogram with intervention. Both options were discussed in detail, including risks and benefits. Patient expressed interest about moving forward with intervention, but recently found out that she has breast cancer, and is finding out about treatment on Monday. Dr. Estanislado Pandy explained to patient that she should focus on her breast cancer now, and after her treatment instructed patient to call us for intervention. Until then, we will monitor with imaging scans.  Discussed stroke symptoms to look for, including left eye diplopia/blurred vision and right-sided weakness. Instructed patient to call 911 immediately if she is experiencing any of these symptoms.  Plan for follow-up with MRA (w/o contrast) in 6 months. Informed patient that our schedulers will call her to set up this imaging scan. Instructed patient to continue taking Aspirin 325 mg once daily.  All questions answered and concerns addressed. Patient and family convey understanding and agree with plan.  Thank you for this interesting consult.  I greatly enjoyed meeting Nicole May and look forward to participating in their care.  A copy of this report was sent to the requesting provider on this date.  Electronically Signed: Earley Abide, PA-C 11/21/2017, 11:05 AM   I spent a total of 25 Minutes in face to face in clinical consultation, greater than 50% of which was counseling/coordinating care for left ICA cavernous segment aneurysm.

## 2017-11-28 DIAGNOSIS — Z807 Family history of other malignant neoplasms of lymphoid, hematopoietic and related tissues: Secondary | ICD-10-CM

## 2017-11-28 DIAGNOSIS — M8580 Other specified disorders of bone density and structure, unspecified site: Secondary | ICD-10-CM

## 2017-11-28 DIAGNOSIS — C50412 Malignant neoplasm of upper-outer quadrant of left female breast: Secondary | ICD-10-CM | POA: Diagnosis not present

## 2017-11-28 DIAGNOSIS — Z803 Family history of malignant neoplasm of breast: Secondary | ICD-10-CM | POA: Diagnosis not present

## 2017-11-28 DIAGNOSIS — Z8049 Family history of malignant neoplasm of other genital organs: Secondary | ICD-10-CM | POA: Diagnosis not present

## 2017-11-28 DIAGNOSIS — C50212 Malignant neoplasm of upper-inner quadrant of left female breast: Secondary | ICD-10-CM | POA: Diagnosis not present

## 2017-11-28 DIAGNOSIS — Z801 Family history of malignant neoplasm of trachea, bronchus and lung: Secondary | ICD-10-CM

## 2017-11-28 DIAGNOSIS — Z170 Estrogen receptor positive status [ER+]: Secondary | ICD-10-CM | POA: Diagnosis not present

## 2017-11-28 DIAGNOSIS — Z87891 Personal history of nicotine dependence: Secondary | ICD-10-CM

## 2017-12-06 DIAGNOSIS — J449 Chronic obstructive pulmonary disease, unspecified: Secondary | ICD-10-CM | POA: Diagnosis not present

## 2017-12-09 DIAGNOSIS — C50312 Malignant neoplasm of lower-inner quadrant of left female breast: Secondary | ICD-10-CM | POA: Diagnosis not present

## 2017-12-09 DIAGNOSIS — Z170 Estrogen receptor positive status [ER+]: Secondary | ICD-10-CM | POA: Diagnosis not present

## 2017-12-16 DIAGNOSIS — Z4789 Encounter for other orthopedic aftercare: Secondary | ICD-10-CM | POA: Diagnosis not present

## 2017-12-17 DIAGNOSIS — I10 Essential (primary) hypertension: Secondary | ICD-10-CM | POA: Diagnosis not present

## 2017-12-17 DIAGNOSIS — C773 Secondary and unspecified malignant neoplasm of axilla and upper limb lymph nodes: Secondary | ICD-10-CM | POA: Diagnosis not present

## 2017-12-17 DIAGNOSIS — C50312 Malignant neoplasm of lower-inner quadrant of left female breast: Secondary | ICD-10-CM | POA: Diagnosis not present

## 2017-12-17 DIAGNOSIS — J449 Chronic obstructive pulmonary disease, unspecified: Secondary | ICD-10-CM | POA: Diagnosis not present

## 2017-12-17 DIAGNOSIS — I728 Aneurysm of other specified arteries: Secondary | ICD-10-CM | POA: Diagnosis not present

## 2017-12-17 DIAGNOSIS — Z170 Estrogen receptor positive status [ER+]: Secondary | ICD-10-CM | POA: Diagnosis not present

## 2017-12-17 DIAGNOSIS — Z79899 Other long term (current) drug therapy: Secondary | ICD-10-CM | POA: Diagnosis not present

## 2017-12-17 DIAGNOSIS — G20 Parkinson's disease: Secondary | ICD-10-CM | POA: Diagnosis not present

## 2017-12-17 DIAGNOSIS — Z01818 Encounter for other preprocedural examination: Secondary | ICD-10-CM | POA: Diagnosis not present

## 2017-12-17 DIAGNOSIS — E1151 Type 2 diabetes mellitus with diabetic peripheral angiopathy without gangrene: Secondary | ICD-10-CM | POA: Diagnosis not present

## 2017-12-17 DIAGNOSIS — I517 Cardiomegaly: Secondary | ICD-10-CM | POA: Diagnosis not present

## 2017-12-17 DIAGNOSIS — Z9981 Dependence on supplemental oxygen: Secondary | ICD-10-CM | POA: Diagnosis not present

## 2017-12-17 DIAGNOSIS — R928 Other abnormal and inconclusive findings on diagnostic imaging of breast: Secondary | ICD-10-CM | POA: Diagnosis not present

## 2017-12-17 DIAGNOSIS — C50912 Malignant neoplasm of unspecified site of left female breast: Secondary | ICD-10-CM | POA: Diagnosis not present

## 2017-12-17 DIAGNOSIS — Z7982 Long term (current) use of aspirin: Secondary | ICD-10-CM | POA: Diagnosis not present

## 2017-12-18 DIAGNOSIS — Z7982 Long term (current) use of aspirin: Secondary | ICD-10-CM | POA: Diagnosis not present

## 2017-12-18 DIAGNOSIS — Z01818 Encounter for other preprocedural examination: Secondary | ICD-10-CM | POA: Diagnosis not present

## 2017-12-18 DIAGNOSIS — Z170 Estrogen receptor positive status [ER+]: Secondary | ICD-10-CM | POA: Diagnosis not present

## 2017-12-18 DIAGNOSIS — G20 Parkinson's disease: Secondary | ICD-10-CM | POA: Diagnosis not present

## 2017-12-18 DIAGNOSIS — C50312 Malignant neoplasm of lower-inner quadrant of left female breast: Secondary | ICD-10-CM | POA: Diagnosis not present

## 2017-12-18 DIAGNOSIS — Z79899 Other long term (current) drug therapy: Secondary | ICD-10-CM | POA: Diagnosis not present

## 2017-12-18 DIAGNOSIS — I728 Aneurysm of other specified arteries: Secondary | ICD-10-CM | POA: Diagnosis not present

## 2017-12-18 DIAGNOSIS — E1151 Type 2 diabetes mellitus with diabetic peripheral angiopathy without gangrene: Secondary | ICD-10-CM | POA: Diagnosis not present

## 2017-12-18 DIAGNOSIS — I10 Essential (primary) hypertension: Secondary | ICD-10-CM | POA: Diagnosis not present

## 2017-12-18 DIAGNOSIS — Z9981 Dependence on supplemental oxygen: Secondary | ICD-10-CM | POA: Diagnosis not present

## 2017-12-18 DIAGNOSIS — C773 Secondary and unspecified malignant neoplasm of axilla and upper limb lymph nodes: Secondary | ICD-10-CM | POA: Diagnosis not present

## 2017-12-18 DIAGNOSIS — J449 Chronic obstructive pulmonary disease, unspecified: Secondary | ICD-10-CM | POA: Diagnosis not present

## 2017-12-19 DIAGNOSIS — G20 Parkinson's disease: Secondary | ICD-10-CM | POA: Diagnosis not present

## 2017-12-19 DIAGNOSIS — E1151 Type 2 diabetes mellitus with diabetic peripheral angiopathy without gangrene: Secondary | ICD-10-CM | POA: Diagnosis not present

## 2017-12-19 DIAGNOSIS — I728 Aneurysm of other specified arteries: Secondary | ICD-10-CM | POA: Diagnosis not present

## 2017-12-19 DIAGNOSIS — I10 Essential (primary) hypertension: Secondary | ICD-10-CM | POA: Diagnosis not present

## 2017-12-19 DIAGNOSIS — Z01818 Encounter for other preprocedural examination: Secondary | ICD-10-CM | POA: Diagnosis not present

## 2017-12-19 DIAGNOSIS — C773 Secondary and unspecified malignant neoplasm of axilla and upper limb lymph nodes: Secondary | ICD-10-CM | POA: Diagnosis not present

## 2017-12-19 DIAGNOSIS — C50312 Malignant neoplasm of lower-inner quadrant of left female breast: Secondary | ICD-10-CM | POA: Diagnosis not present

## 2017-12-19 DIAGNOSIS — Z170 Estrogen receptor positive status [ER+]: Secondary | ICD-10-CM | POA: Diagnosis not present

## 2017-12-19 DIAGNOSIS — J449 Chronic obstructive pulmonary disease, unspecified: Secondary | ICD-10-CM | POA: Diagnosis not present

## 2017-12-19 DIAGNOSIS — Z9981 Dependence on supplemental oxygen: Secondary | ICD-10-CM | POA: Diagnosis not present

## 2017-12-19 DIAGNOSIS — Z79899 Other long term (current) drug therapy: Secondary | ICD-10-CM | POA: Diagnosis not present

## 2017-12-19 DIAGNOSIS — Z7982 Long term (current) use of aspirin: Secondary | ICD-10-CM | POA: Diagnosis not present

## 2018-01-06 DIAGNOSIS — J449 Chronic obstructive pulmonary disease, unspecified: Secondary | ICD-10-CM | POA: Diagnosis not present

## 2018-01-07 DIAGNOSIS — C50212 Malignant neoplasm of upper-inner quadrant of left female breast: Secondary | ICD-10-CM | POA: Diagnosis not present

## 2018-01-07 DIAGNOSIS — M8580 Other specified disorders of bone density and structure, unspecified site: Secondary | ICD-10-CM | POA: Diagnosis not present

## 2018-01-07 DIAGNOSIS — N61 Mastitis without abscess: Secondary | ICD-10-CM | POA: Diagnosis not present

## 2018-01-07 DIAGNOSIS — Z79811 Long term (current) use of aromatase inhibitors: Secondary | ICD-10-CM | POA: Diagnosis not present

## 2018-01-07 DIAGNOSIS — Z87891 Personal history of nicotine dependence: Secondary | ICD-10-CM

## 2018-01-07 DIAGNOSIS — Z803 Family history of malignant neoplasm of breast: Secondary | ICD-10-CM

## 2018-01-07 DIAGNOSIS — Z9012 Acquired absence of left breast and nipple: Secondary | ICD-10-CM

## 2018-01-07 DIAGNOSIS — Z801 Family history of malignant neoplasm of trachea, bronchus and lung: Secondary | ICD-10-CM

## 2018-01-07 DIAGNOSIS — L03313 Cellulitis of chest wall: Secondary | ICD-10-CM | POA: Diagnosis not present

## 2018-01-07 DIAGNOSIS — Z170 Estrogen receptor positive status [ER+]: Secondary | ICD-10-CM | POA: Diagnosis not present

## 2018-01-07 DIAGNOSIS — T8149XA Infection following a procedure, other surgical site, initial encounter: Secondary | ICD-10-CM | POA: Diagnosis not present

## 2018-01-07 DIAGNOSIS — Z8049 Family history of malignant neoplasm of other genital organs: Secondary | ICD-10-CM

## 2018-01-07 DIAGNOSIS — Z807 Family history of other malignant neoplasms of lymphoid, hematopoietic and related tissues: Secondary | ICD-10-CM

## 2018-01-15 DIAGNOSIS — Z4789 Encounter for other orthopedic aftercare: Secondary | ICD-10-CM | POA: Diagnosis not present

## 2018-01-15 DIAGNOSIS — S82841D Displaced bimalleolar fracture of right lower leg, subsequent encounter for closed fracture with routine healing: Secondary | ICD-10-CM | POA: Diagnosis not present

## 2018-01-15 DIAGNOSIS — W19XXXD Unspecified fall, subsequent encounter: Secondary | ICD-10-CM | POA: Diagnosis not present

## 2018-02-05 DIAGNOSIS — J449 Chronic obstructive pulmonary disease, unspecified: Secondary | ICD-10-CM | POA: Diagnosis not present

## 2018-02-14 DIAGNOSIS — W19XXXD Unspecified fall, subsequent encounter: Secondary | ICD-10-CM | POA: Diagnosis not present

## 2018-02-14 DIAGNOSIS — Z4789 Encounter for other orthopedic aftercare: Secondary | ICD-10-CM | POA: Diagnosis not present

## 2018-02-14 DIAGNOSIS — S82841D Displaced bimalleolar fracture of right lower leg, subsequent encounter for closed fracture with routine healing: Secondary | ICD-10-CM | POA: Diagnosis not present

## 2018-03-08 DIAGNOSIS — J449 Chronic obstructive pulmonary disease, unspecified: Secondary | ICD-10-CM | POA: Diagnosis not present

## 2018-03-09 DIAGNOSIS — Z139 Encounter for screening, unspecified: Secondary | ICD-10-CM | POA: Diagnosis not present

## 2018-03-09 DIAGNOSIS — Z23 Encounter for immunization: Secondary | ICD-10-CM | POA: Diagnosis not present

## 2018-03-09 DIAGNOSIS — E785 Hyperlipidemia, unspecified: Secondary | ICD-10-CM | POA: Diagnosis not present

## 2018-03-09 DIAGNOSIS — I10 Essential (primary) hypertension: Secondary | ICD-10-CM | POA: Diagnosis not present

## 2018-03-09 DIAGNOSIS — E1142 Type 2 diabetes mellitus with diabetic polyneuropathy: Secondary | ICD-10-CM | POA: Diagnosis not present

## 2018-03-09 DIAGNOSIS — J449 Chronic obstructive pulmonary disease, unspecified: Secondary | ICD-10-CM | POA: Diagnosis not present

## 2018-03-09 DIAGNOSIS — Z79891 Long term (current) use of opiate analgesic: Secondary | ICD-10-CM | POA: Diagnosis not present

## 2018-03-09 DIAGNOSIS — E559 Vitamin D deficiency, unspecified: Secondary | ICD-10-CM | POA: Diagnosis not present

## 2018-03-10 DIAGNOSIS — M25551 Pain in right hip: Secondary | ICD-10-CM | POA: Diagnosis not present

## 2018-03-10 DIAGNOSIS — S82891D Other fracture of right lower leg, subsequent encounter for closed fracture with routine healing: Secondary | ICD-10-CM | POA: Diagnosis not present

## 2018-03-10 DIAGNOSIS — M79645 Pain in left finger(s): Secondary | ICD-10-CM | POA: Diagnosis not present

## 2018-03-11 DIAGNOSIS — M79645 Pain in left finger(s): Secondary | ICD-10-CM | POA: Diagnosis not present

## 2018-03-11 DIAGNOSIS — E119 Type 2 diabetes mellitus without complications: Secondary | ICD-10-CM | POA: Diagnosis not present

## 2018-03-11 DIAGNOSIS — H5213 Myopia, bilateral: Secondary | ICD-10-CM | POA: Diagnosis not present

## 2018-03-17 DIAGNOSIS — Z4789 Encounter for other orthopedic aftercare: Secondary | ICD-10-CM | POA: Diagnosis not present

## 2018-03-17 DIAGNOSIS — S82841D Displaced bimalleolar fracture of right lower leg, subsequent encounter for closed fracture with routine healing: Secondary | ICD-10-CM | POA: Diagnosis not present

## 2018-03-17 DIAGNOSIS — W19XXXD Unspecified fall, subsequent encounter: Secondary | ICD-10-CM | POA: Diagnosis not present

## 2018-03-25 DIAGNOSIS — M25551 Pain in right hip: Secondary | ICD-10-CM | POA: Diagnosis not present

## 2018-03-25 DIAGNOSIS — M4317 Spondylolisthesis, lumbosacral region: Secondary | ICD-10-CM | POA: Diagnosis not present

## 2018-04-01 DIAGNOSIS — C50212 Malignant neoplasm of upper-inner quadrant of left female breast: Secondary | ICD-10-CM | POA: Diagnosis not present

## 2018-04-01 DIAGNOSIS — Z853 Personal history of malignant neoplasm of breast: Secondary | ICD-10-CM | POA: Diagnosis not present

## 2018-04-01 DIAGNOSIS — Z9012 Acquired absence of left breast and nipple: Secondary | ICD-10-CM | POA: Diagnosis not present

## 2018-04-01 DIAGNOSIS — Z170 Estrogen receptor positive status [ER+]: Secondary | ICD-10-CM | POA: Diagnosis not present

## 2018-04-01 DIAGNOSIS — Z87891 Personal history of nicotine dependence: Secondary | ICD-10-CM | POA: Diagnosis not present

## 2018-04-01 DIAGNOSIS — Z79811 Long term (current) use of aromatase inhibitors: Secondary | ICD-10-CM | POA: Diagnosis not present

## 2018-04-07 DIAGNOSIS — J449 Chronic obstructive pulmonary disease, unspecified: Secondary | ICD-10-CM | POA: Diagnosis not present

## 2018-04-16 DIAGNOSIS — S82841D Displaced bimalleolar fracture of right lower leg, subsequent encounter for closed fracture with routine healing: Secondary | ICD-10-CM | POA: Diagnosis not present

## 2018-04-16 DIAGNOSIS — W19XXXD Unspecified fall, subsequent encounter: Secondary | ICD-10-CM | POA: Diagnosis not present

## 2018-04-16 DIAGNOSIS — Z4789 Encounter for other orthopedic aftercare: Secondary | ICD-10-CM | POA: Diagnosis not present

## 2018-04-21 DIAGNOSIS — M25551 Pain in right hip: Secondary | ICD-10-CM | POA: Diagnosis not present

## 2018-04-21 DIAGNOSIS — M79642 Pain in left hand: Secondary | ICD-10-CM | POA: Diagnosis not present

## 2018-04-21 DIAGNOSIS — M545 Low back pain: Secondary | ICD-10-CM | POA: Diagnosis not present

## 2018-05-08 DIAGNOSIS — J449 Chronic obstructive pulmonary disease, unspecified: Secondary | ICD-10-CM | POA: Diagnosis not present

## 2018-05-19 DIAGNOSIS — M5416 Radiculopathy, lumbar region: Secondary | ICD-10-CM | POA: Diagnosis not present

## 2018-05-19 DIAGNOSIS — M5441 Lumbago with sciatica, right side: Secondary | ICD-10-CM | POA: Diagnosis not present

## 2018-05-19 DIAGNOSIS — M545 Low back pain: Secondary | ICD-10-CM | POA: Diagnosis not present

## 2018-05-19 DIAGNOSIS — G8929 Other chronic pain: Secondary | ICD-10-CM | POA: Diagnosis not present

## 2018-05-19 DIAGNOSIS — M7061 Trochanteric bursitis, right hip: Secondary | ICD-10-CM | POA: Diagnosis not present

## 2018-05-26 ENCOUNTER — Telehealth (HOSPITAL_COMMUNITY): Payer: Self-pay

## 2018-05-26 NOTE — Telephone Encounter (Signed)
Called to schedule f/u mra, no answer, left vm. AW 

## 2018-05-28 ENCOUNTER — Other Ambulatory Visit (HOSPITAL_COMMUNITY): Payer: Self-pay | Admitting: Interventional Radiology

## 2018-05-28 DIAGNOSIS — I729 Aneurysm of unspecified site: Secondary | ICD-10-CM

## 2018-06-08 DIAGNOSIS — J449 Chronic obstructive pulmonary disease, unspecified: Secondary | ICD-10-CM | POA: Diagnosis not present

## 2018-06-17 DIAGNOSIS — M5416 Radiculopathy, lumbar region: Secondary | ICD-10-CM | POA: Diagnosis not present

## 2018-06-25 ENCOUNTER — Ambulatory Visit (HOSPITAL_COMMUNITY)
Admission: RE | Admit: 2018-06-25 | Discharge: 2018-06-25 | Disposition: A | Discharge status: Home / Self Care | Payer: Medicare Other | Source: Ambulatory Visit | Attending: Interventional Radiology | Admitting: Interventional Radiology

## 2018-06-25 DIAGNOSIS — I729 Aneurysm of unspecified site: Secondary | ICD-10-CM | POA: Diagnosis present

## 2018-06-25 DIAGNOSIS — I671 Cerebral aneurysm, nonruptured: Secondary | ICD-10-CM | POA: Diagnosis not present

## 2018-07-01 ENCOUNTER — Telehealth (HOSPITAL_COMMUNITY): Payer: Self-pay

## 2018-07-01 DIAGNOSIS — R300 Dysuria: Secondary | ICD-10-CM | POA: Diagnosis not present

## 2018-07-01 DIAGNOSIS — Z79811 Long term (current) use of aromatase inhibitors: Secondary | ICD-10-CM | POA: Diagnosis not present

## 2018-07-01 DIAGNOSIS — Z853 Personal history of malignant neoplasm of breast: Secondary | ICD-10-CM | POA: Diagnosis not present

## 2018-07-01 NOTE — Telephone Encounter (Signed)
Called pt regarding recent mra, no answer, left vm. AW 

## 2018-07-07 DIAGNOSIS — J449 Chronic obstructive pulmonary disease, unspecified: Secondary | ICD-10-CM | POA: Diagnosis not present

## 2018-07-09 ENCOUNTER — Telehealth (HOSPITAL_COMMUNITY): Payer: Self-pay | Admitting: Radiology

## 2018-07-09 NOTE — Telephone Encounter (Signed)
Called Nicole May, told her per Deveshwar recent imaging is grossly unchanged and f/u MRI/MRA in 6 months. Per Nicole May pt is asymptomatic and agrees with this plan of care. JM

## 2018-08-07 DIAGNOSIS — J449 Chronic obstructive pulmonary disease, unspecified: Secondary | ICD-10-CM | POA: Diagnosis not present

## 2018-09-06 DIAGNOSIS — J449 Chronic obstructive pulmonary disease, unspecified: Secondary | ICD-10-CM | POA: Diagnosis not present

## 2018-09-07 DIAGNOSIS — Z0000 Encounter for general adult medical examination without abnormal findings: Secondary | ICD-10-CM | POA: Diagnosis not present

## 2018-09-07 DIAGNOSIS — Z9181 History of falling: Secondary | ICD-10-CM | POA: Diagnosis not present

## 2018-09-07 DIAGNOSIS — E785 Hyperlipidemia, unspecified: Secondary | ICD-10-CM | POA: Diagnosis not present

## 2018-09-07 DIAGNOSIS — Z1231 Encounter for screening mammogram for malignant neoplasm of breast: Secondary | ICD-10-CM | POA: Diagnosis not present

## 2018-09-08 DIAGNOSIS — I10 Essential (primary) hypertension: Secondary | ICD-10-CM | POA: Diagnosis not present

## 2018-09-08 DIAGNOSIS — E1142 Type 2 diabetes mellitus with diabetic polyneuropathy: Secondary | ICD-10-CM | POA: Diagnosis not present

## 2018-09-08 DIAGNOSIS — J449 Chronic obstructive pulmonary disease, unspecified: Secondary | ICD-10-CM | POA: Diagnosis not present

## 2018-09-08 DIAGNOSIS — E785 Hyperlipidemia, unspecified: Secondary | ICD-10-CM | POA: Diagnosis not present

## 2018-09-08 DIAGNOSIS — E559 Vitamin D deficiency, unspecified: Secondary | ICD-10-CM | POA: Diagnosis not present

## 2018-10-07 DIAGNOSIS — J449 Chronic obstructive pulmonary disease, unspecified: Secondary | ICD-10-CM | POA: Diagnosis not present

## 2018-10-13 DIAGNOSIS — H18413 Arcus senilis, bilateral: Secondary | ICD-10-CM | POA: Diagnosis not present

## 2018-10-13 DIAGNOSIS — H2511 Age-related nuclear cataract, right eye: Secondary | ICD-10-CM | POA: Diagnosis not present

## 2018-10-13 DIAGNOSIS — H25043 Posterior subcapsular polar age-related cataract, bilateral: Secondary | ICD-10-CM | POA: Diagnosis not present

## 2018-10-13 DIAGNOSIS — H401131 Primary open-angle glaucoma, bilateral, mild stage: Secondary | ICD-10-CM | POA: Diagnosis not present

## 2018-10-13 DIAGNOSIS — H2513 Age-related nuclear cataract, bilateral: Secondary | ICD-10-CM | POA: Diagnosis not present

## 2018-10-19 DIAGNOSIS — Z853 Personal history of malignant neoplasm of breast: Secondary | ICD-10-CM | POA: Diagnosis not present

## 2018-11-06 DIAGNOSIS — J449 Chronic obstructive pulmonary disease, unspecified: Secondary | ICD-10-CM | POA: Diagnosis not present

## 2018-12-07 DIAGNOSIS — J449 Chronic obstructive pulmonary disease, unspecified: Secondary | ICD-10-CM | POA: Diagnosis not present

## 2018-12-16 DIAGNOSIS — Z961 Presence of intraocular lens: Secondary | ICD-10-CM | POA: Diagnosis not present

## 2018-12-16 DIAGNOSIS — H25811 Combined forms of age-related cataract, right eye: Secondary | ICD-10-CM | POA: Diagnosis not present

## 2018-12-16 DIAGNOSIS — H2511 Age-related nuclear cataract, right eye: Secondary | ICD-10-CM | POA: Diagnosis not present

## 2018-12-17 DIAGNOSIS — H2512 Age-related nuclear cataract, left eye: Secondary | ICD-10-CM | POA: Diagnosis not present

## 2018-12-31 DIAGNOSIS — C50919 Malignant neoplasm of unspecified site of unspecified female breast: Secondary | ICD-10-CM | POA: Diagnosis not present

## 2019-01-07 DIAGNOSIS — J449 Chronic obstructive pulmonary disease, unspecified: Secondary | ICD-10-CM | POA: Diagnosis not present

## 2019-01-25 DIAGNOSIS — H2512 Age-related nuclear cataract, left eye: Secondary | ICD-10-CM | POA: Diagnosis not present

## 2019-01-25 DIAGNOSIS — H25812 Combined forms of age-related cataract, left eye: Secondary | ICD-10-CM | POA: Diagnosis not present

## 2019-02-03 ENCOUNTER — Other Ambulatory Visit (HOSPITAL_COMMUNITY): Payer: Self-pay | Admitting: Interventional Radiology

## 2019-02-03 DIAGNOSIS — I771 Stricture of artery: Secondary | ICD-10-CM

## 2019-02-03 DIAGNOSIS — I671 Cerebral aneurysm, nonruptured: Secondary | ICD-10-CM

## 2019-02-06 DIAGNOSIS — J449 Chronic obstructive pulmonary disease, unspecified: Secondary | ICD-10-CM | POA: Diagnosis not present

## 2019-02-22 ENCOUNTER — Encounter (HOSPITAL_COMMUNITY): Payer: Self-pay

## 2019-02-22 ENCOUNTER — Ambulatory Visit (HOSPITAL_COMMUNITY): Admission: RE | Admit: 2019-02-22 | Payer: Medicare Other | Source: Ambulatory Visit

## 2019-02-22 ENCOUNTER — Ambulatory Visit (HOSPITAL_COMMUNITY): Payer: Medicare Other

## 2019-03-09 DIAGNOSIS — J449 Chronic obstructive pulmonary disease, unspecified: Secondary | ICD-10-CM | POA: Diagnosis not present

## 2019-03-23 ENCOUNTER — Ambulatory Visit (HOSPITAL_COMMUNITY): Admission: RE | Admit: 2019-03-23 | Payer: Medicare Other | Source: Ambulatory Visit

## 2019-03-23 ENCOUNTER — Ambulatory Visit (HOSPITAL_COMMUNITY): Payer: Medicare Other | Attending: Interventional Radiology

## 2019-03-30 ENCOUNTER — Telehealth (HOSPITAL_COMMUNITY): Payer: Self-pay

## 2019-03-30 NOTE — Telephone Encounter (Signed)
Called pt's granddaughter to reschedule mri, no answer, left vm. AW

## 2019-04-08 DIAGNOSIS — J449 Chronic obstructive pulmonary disease, unspecified: Secondary | ICD-10-CM | POA: Diagnosis not present

## 2019-04-29 DIAGNOSIS — E1142 Type 2 diabetes mellitus with diabetic polyneuropathy: Secondary | ICD-10-CM | POA: Diagnosis not present

## 2019-04-29 DIAGNOSIS — E559 Vitamin D deficiency, unspecified: Secondary | ICD-10-CM | POA: Diagnosis not present

## 2019-04-29 DIAGNOSIS — J449 Chronic obstructive pulmonary disease, unspecified: Secondary | ICD-10-CM | POA: Diagnosis not present

## 2019-04-29 DIAGNOSIS — E785 Hyperlipidemia, unspecified: Secondary | ICD-10-CM | POA: Diagnosis not present

## 2019-04-29 DIAGNOSIS — I10 Essential (primary) hypertension: Secondary | ICD-10-CM | POA: Diagnosis not present

## 2019-05-09 DIAGNOSIS — J449 Chronic obstructive pulmonary disease, unspecified: Secondary | ICD-10-CM | POA: Diagnosis not present

## 2019-05-16 IMAGING — MR MR HEAD WO/W CM
11 of 16 series · 28 of 48 positions shown · IV contrast (multihance)
Comparison: MRI 07/04/2016.  CTA 10/31/2015

CLINICAL DATA: Follow-up aneurysm.  Dizziness and falls

EXAM:
MRI HEAD WITHOUT AND WITH CONTRAST
MRA HEAD WITHOUT CONTRAST
TECHNIQUE: Multiplanar, multiecho pulse sequences of the brain and surrounding
structures were obtained without and with intravenous contrast.
Angiographic images of the head were obtained using MRA technique
without contrast.
CONTRAST:  18mL MULTIHANCE GADOBENATE DIMEGLUMINE 529 MG/ML IV SOLN

[Series 3: DWI · axial · 3.0mm · 1.09mm/px · z∈[-98,+40]mm · 5 of 94 slices shown (1 of 4)]
[im 1/94]
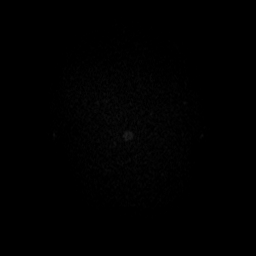
[im 24/94]
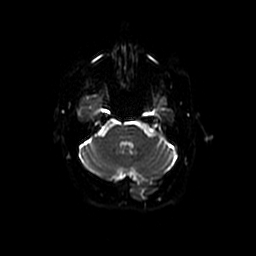
[im 47/94]
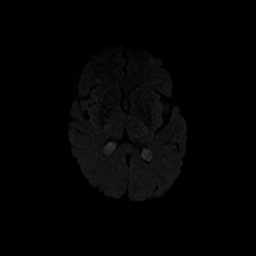
[im 70/94]
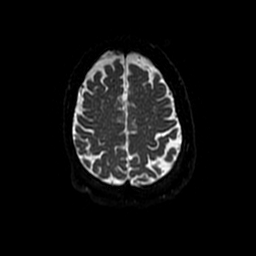
[im 94/94]
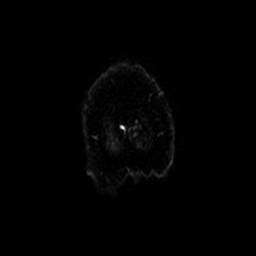

[Series 4: (id) mt fs · axial · 1.4mm · 0.43mm/px · z∈[-109,-83]mm · 3 of 152 slices shown]
[im 1/152]
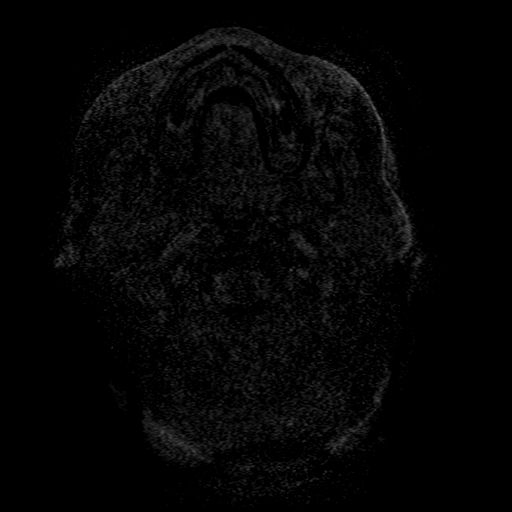
[im 19/152]
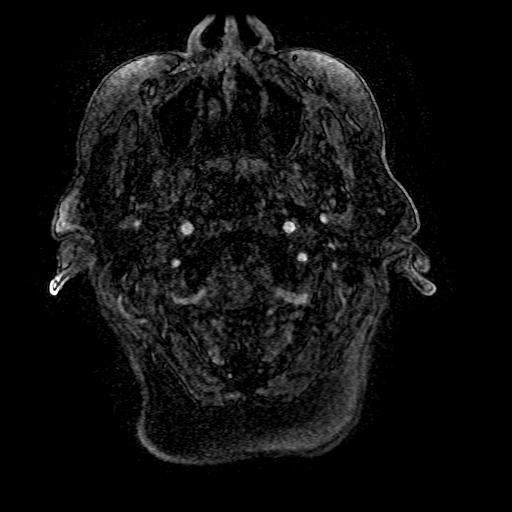
[im 38/152]
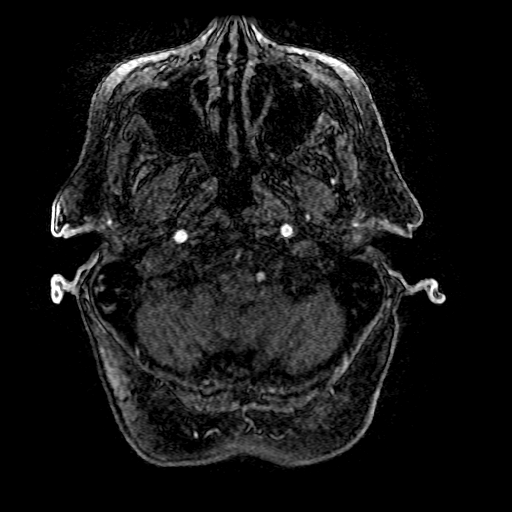

[Series 5: DWI · coronal · 5.0mm · 1.09mm/px · 4 of 68 slices shown (2 of 4)]
[im 1/68]
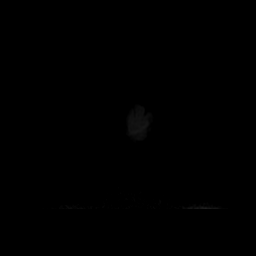
[im 23/68]
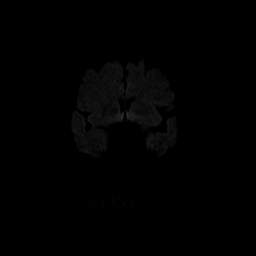
[im 45/68]
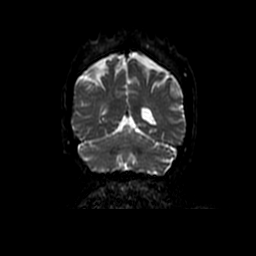
[im 68/68]
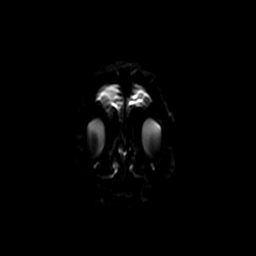

[Series 6: T1 · sagittal · 5.0mm · 0.47mm/px · 2 of 23 slices shown]
[im 1/23]
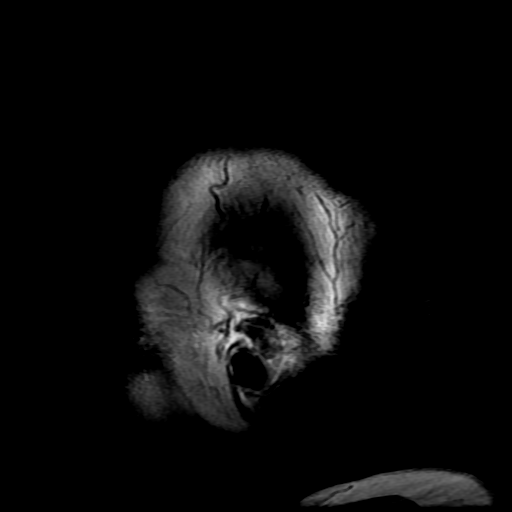
[im 23/23]
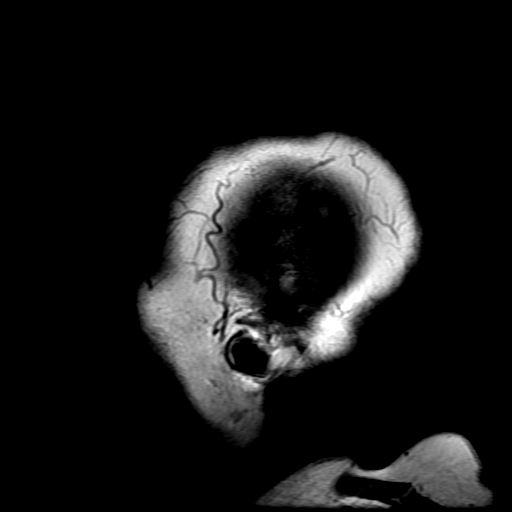

[Series 7: T2 · axial · 5.0mm · 0.43mm/px · z∈[-97,+41]mm · 2 of 24 slices shown (1 of 2)]
[im 1/24]
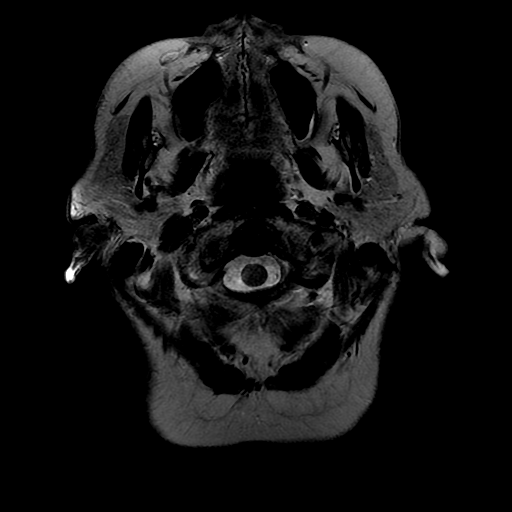
[im 24/24]
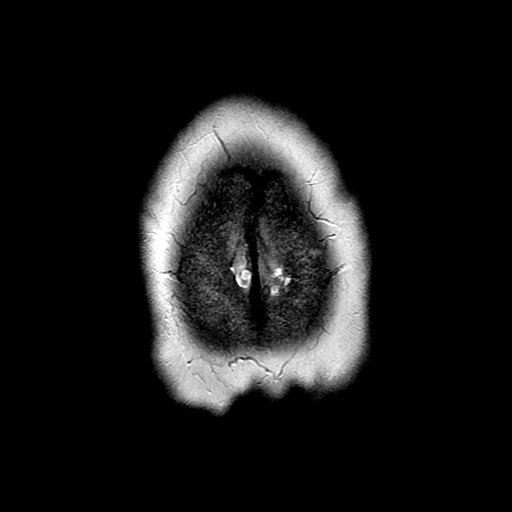

[Series 8: FLAIR · axial · 5.0mm · 0.43mm/px · z∈[-97,+41]mm · 2 of 24 slices shown]
[im 1/24]
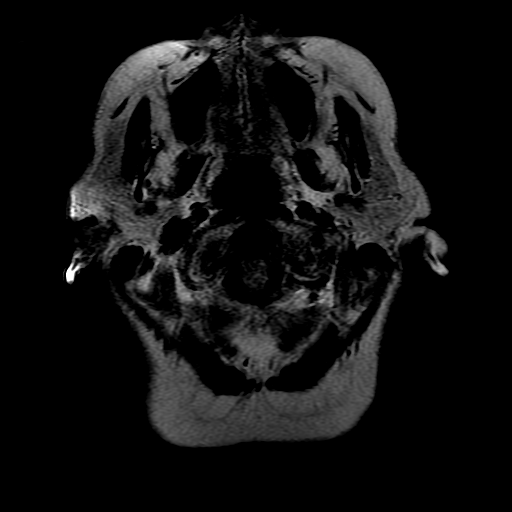
[im 24/24]
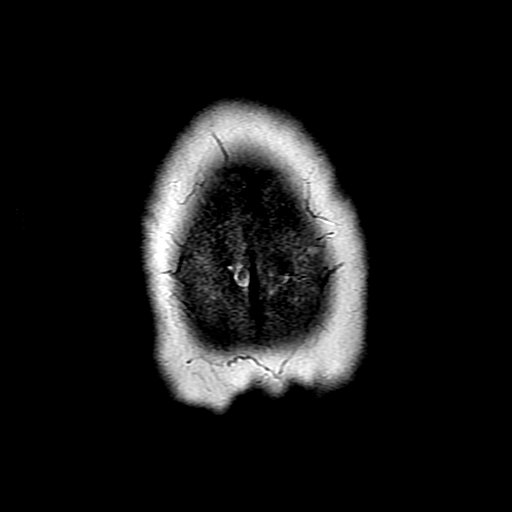

[Series 11: T2 post-contrast · coronal · 5.0mm · 0.39mm/px · 1 of 13 slices shown]
[im 1/13]
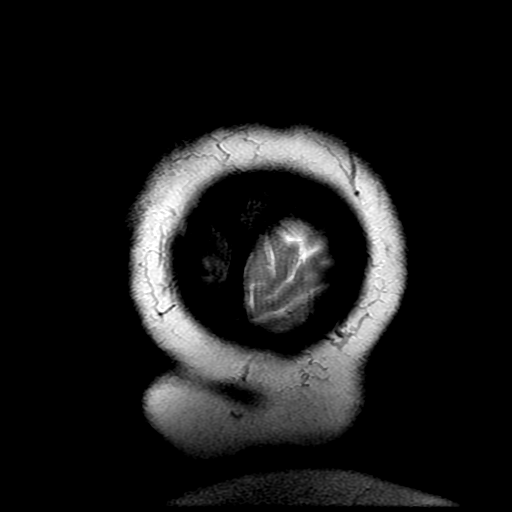

[Series 12: T2 · coronal · 5.0mm · 0.39mm/px · 2 of 26 slices shown (2 of 2)]
[im 1/26]
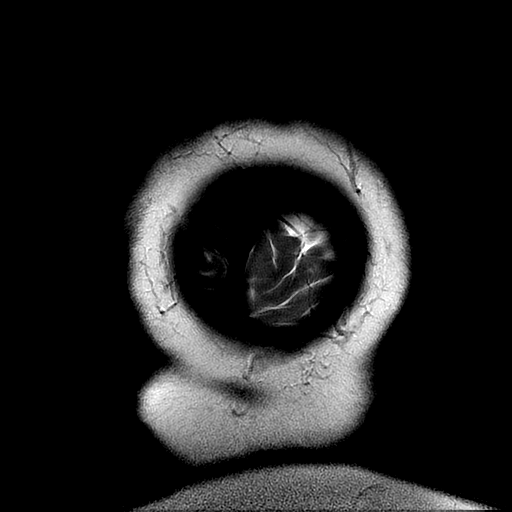
[im 26/26]
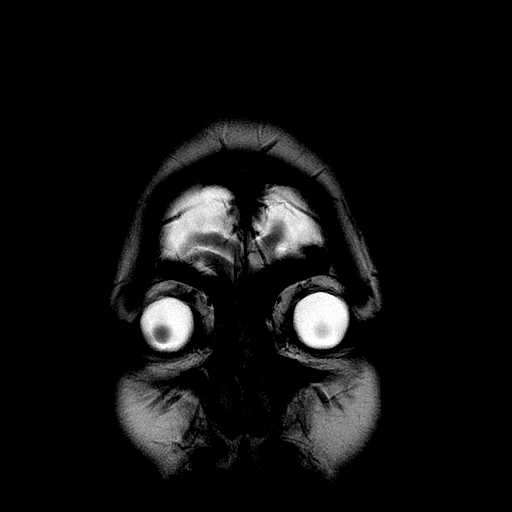

[Series 14: T1 post-contrast · coronal · 5.0mm · 0.39mm/px · 2 of 26 slices shown]
[im 1/26]
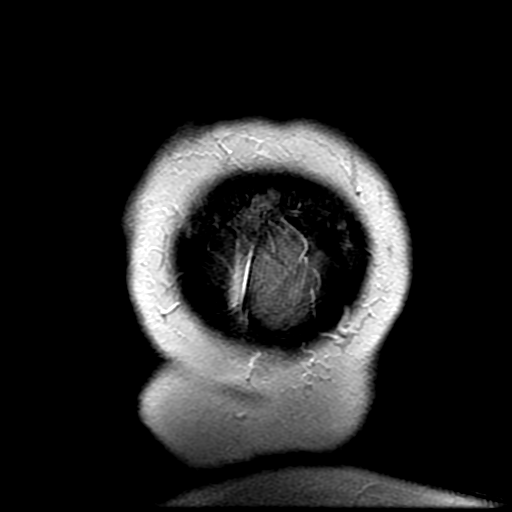
[im 26/26]
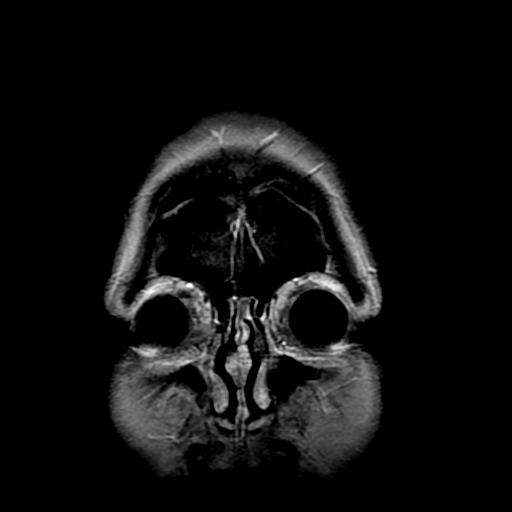

[Series 300: DWI · axial · 3.0mm · 1.09mm/px · z∈[-98,+40]mm · 3 of 47 slices shown (3 of 4)]
[im 1/47]
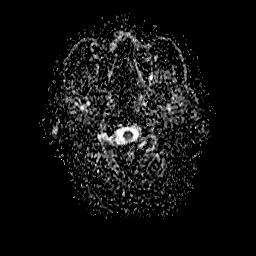
[im 24/47]
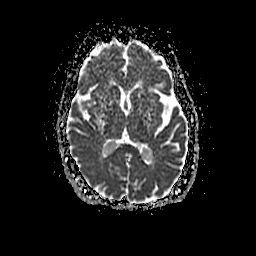
[im 47/47]
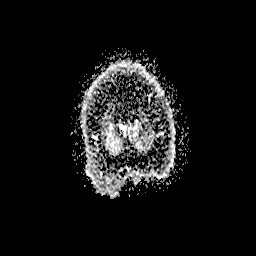

[Series 500: DWI · coronal · 5.0mm · 1.09mm/px · 2 of 34 slices shown (4 of 4)]
[im 1/34]
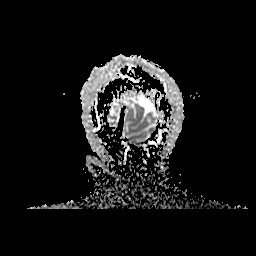
[im 34/34]
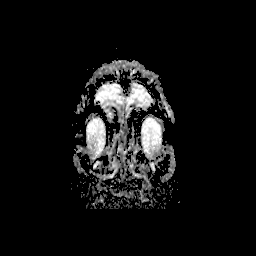

[28 of 48 positions shown; findings below may reference images not displayed]

FINDINGS: MRI HEAD FINDINGS

Brain: Negative for acute infarct. Moderate chronic microvascular
ischemic changes in the white matter and pons. Negative for
intracranial hemorrhage or mass. Normal enhancement postcontrast
infusion

Vascular: Normal arterial flow voids

Skull and upper cervical spine: Negative

Sinuses/Orbits: Mild mastoid sinus effusion bilaterally. Paranasal
sinuses clear

Other: None

MRA HEAD FINDINGS

Aneurysm of the left cavernous carotid projecting inferiorly into
the cavernous sinus measures 6 x 8 mm unchanged from the prior CTA.
Left anterior and middle cerebral artery is widely patent without
stenosis.

Right internal carotid artery widely patent. Right anterior and
middle cerebral artery is widely patent

Both vertebral arteries are patent. There is severe stenosis of the
distal vertebral arteries and the proximal basilar which shows
considerable progression. This area was normal on the CTA [DATE].
Basilar mildly irregularity. Superior cerebellar and posterior
cerebral artery patent bilaterally. Posterior communicating arteries
patent bilaterally.
IMPRESSION: Moderate chronic microvascular ischemic change. No acute
intracranial abnormality.

6 x 8 mm aneurysm left cavernous carotid unchanged

Marked progression of atherosclerotic disease of the vertebral
artery junction causing significant stenosis.

## 2019-06-09 DIAGNOSIS — J449 Chronic obstructive pulmonary disease, unspecified: Secondary | ICD-10-CM | POA: Diagnosis not present

## 2019-07-05 IMAGING — XA IR ANGIO INTRA EXTRACRAN SEL COM CAROTID INNOMINATE BILAT MOD SE
1 of 2 series · 12 of 24 positions shown · IV contrast (IODINE)
Comparison: MRI MRA of the brain of 01/08/2017.

CLINICAL DATA: Worsening gait and balance difficulties. Abnormal
recent MRI of the brain and MRA of the brain suggestive of worsening
bilateral vertebrobasilar atherosclerotic disease.

EXAM:
BILATERAL COMMON CAROTID AND INNOMINATE ANGIOGRAPHY; IR ANGIO
VERTEBRAL SEL VERTEBRAL BILAT MOD SED
TECHNIQUE: Informed written consent was obtained from the patient after a
thorough discussion of the procedural risks, benefits and
alternatives. All questions were addressed. Maximal Sterile Barrier
Technique was utilized including caps, mask, sterile gowns, sterile
gloves, sterile drape, hand hygiene and skin antiseptic. A timeout
was performed prior to the initiation of the procedure.

[Series 300: dr. (person_name) · 12 of 210 slices shown]
[im 1/210]
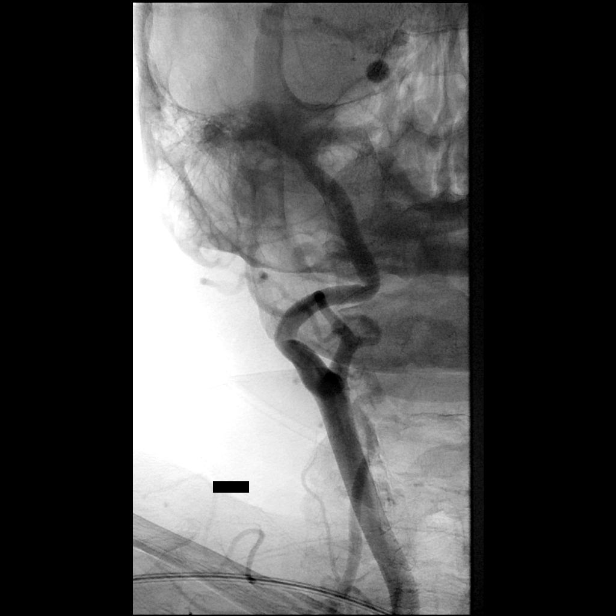
[im 20/210]
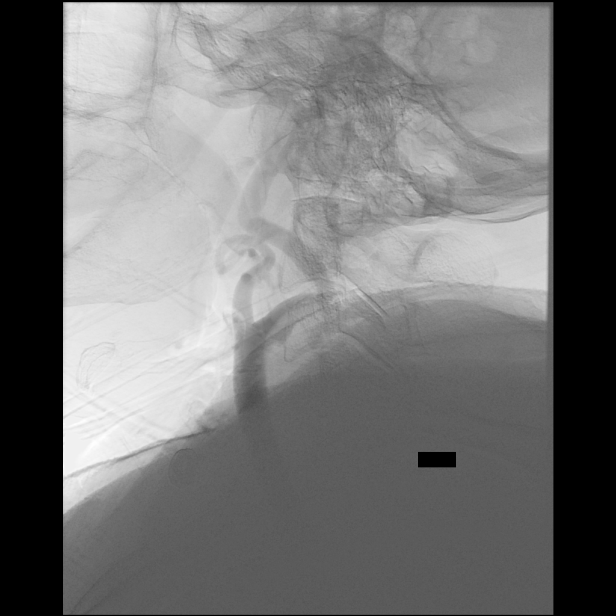
[im 39/210]
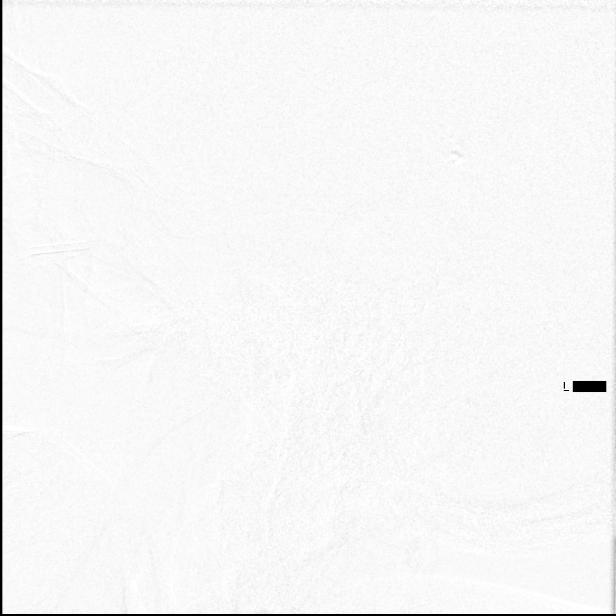
[im 58/210]
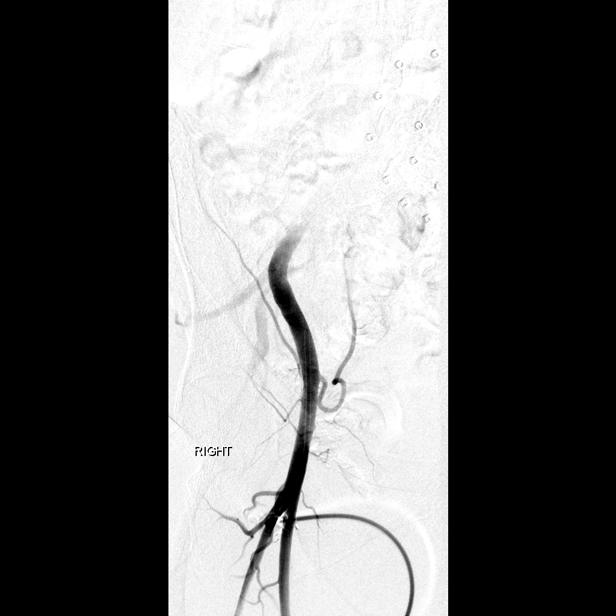
[im 77/210]
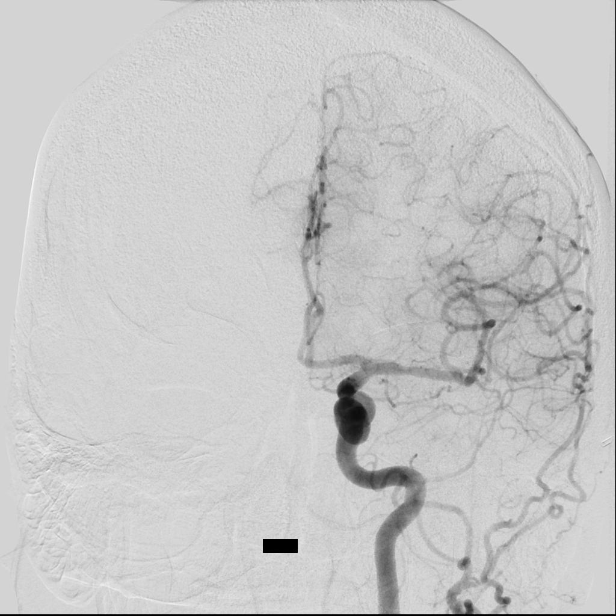
[im 96/210]
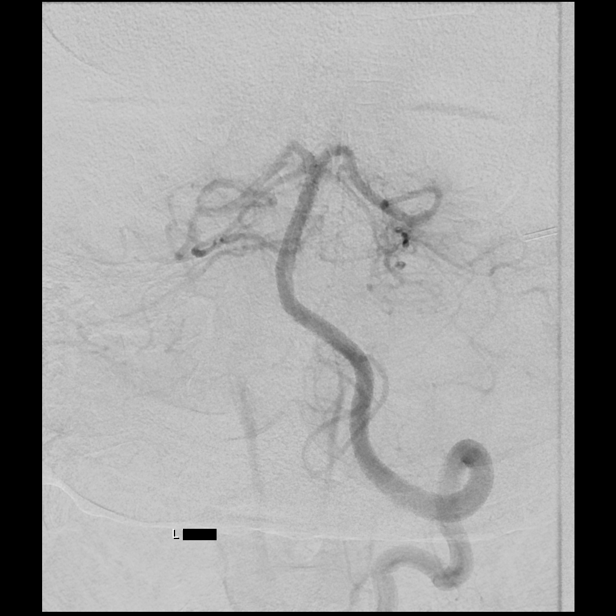
[im 115/210]
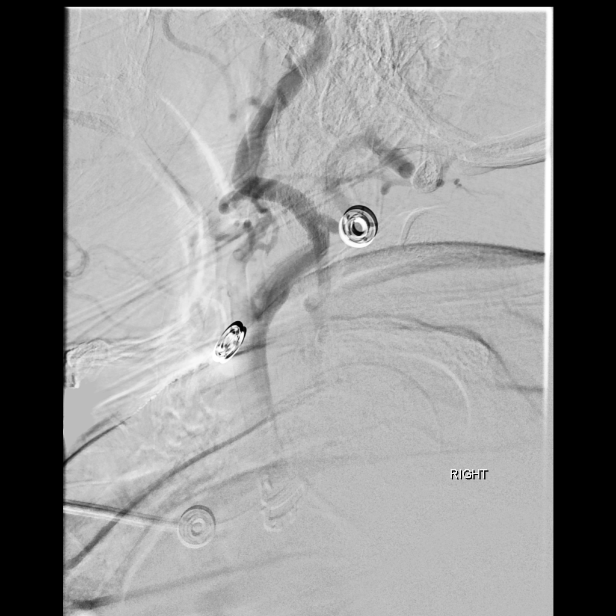
[im 134/210]
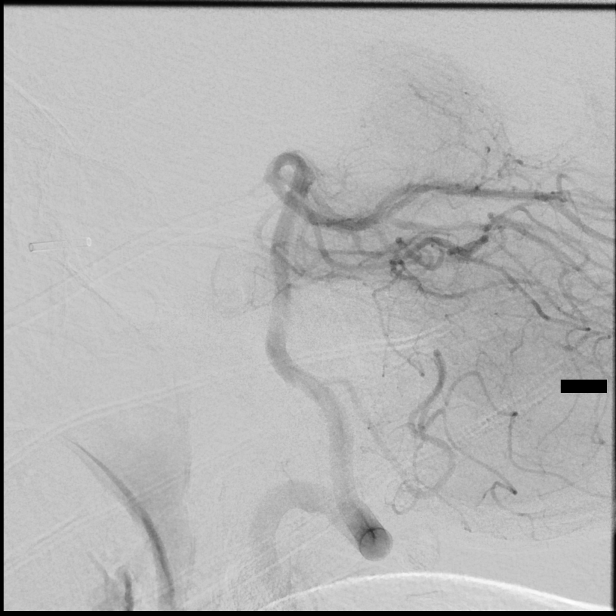
[im 153/210]
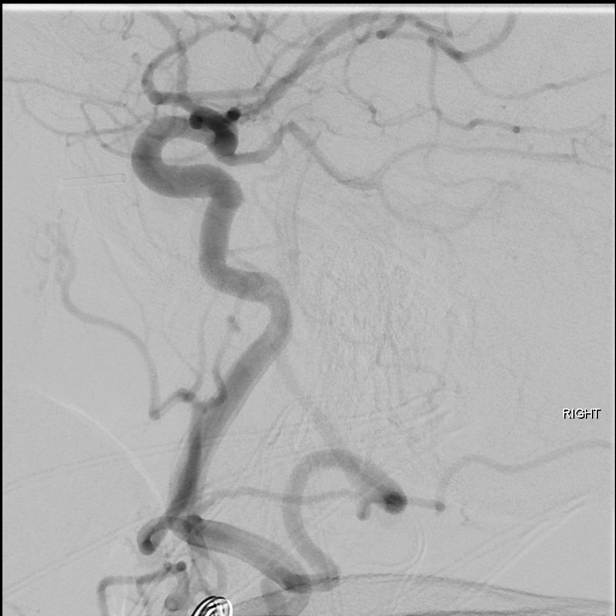
[im 172/210]
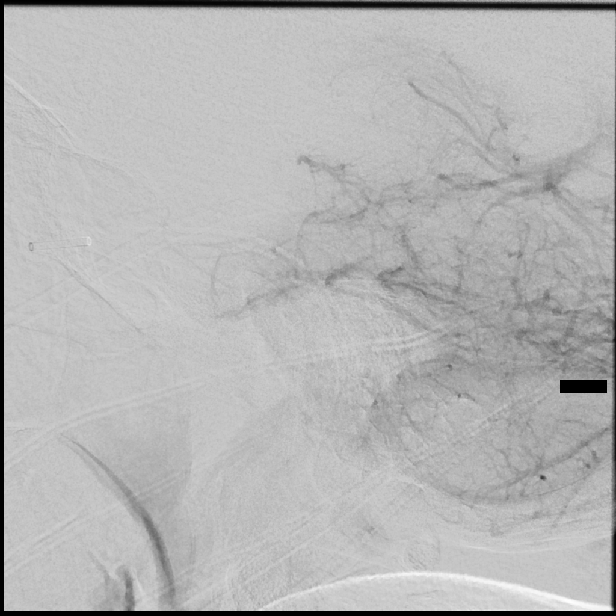
[im 191/210]
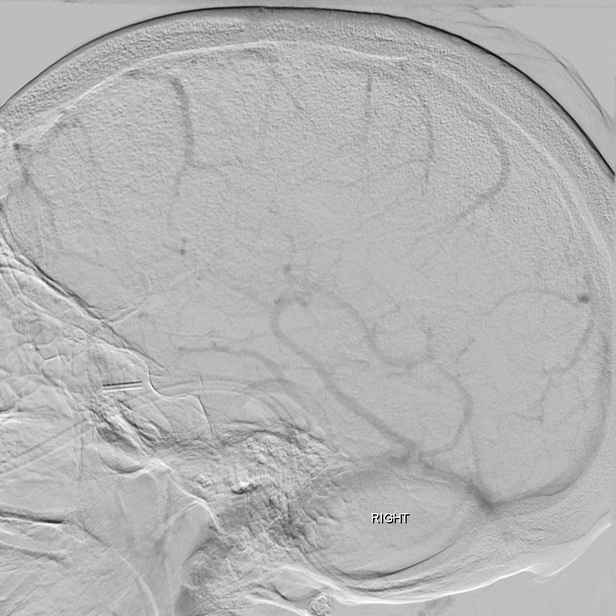
[im 210/210]
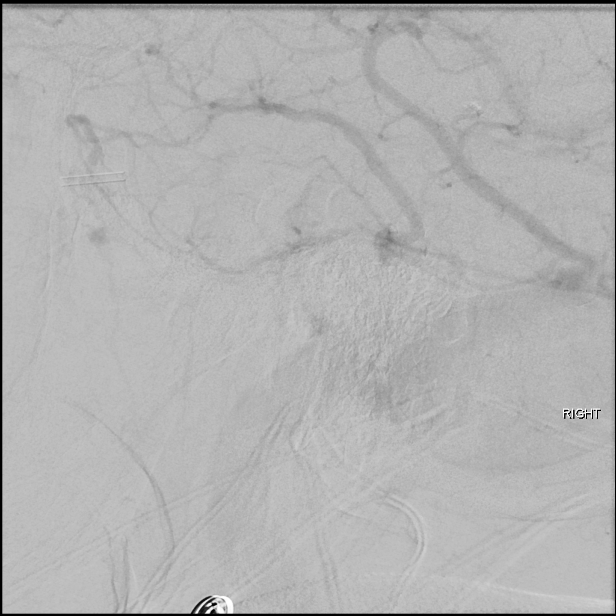

[12 of 24 positions shown; findings below may reference images not displayed]

MEDICATIONS:
Heparin 5888 units IV; no antibiotic was administered within 1 hour
of the procedure.

ANESTHESIA/SEDATION:
Versed 1 mg IV; Fentanyl 20 mcg IV.

Moderate Sedation Time:  30  Minutes.

The patient was continuously monitored during the procedure by the
interventional radiology nurse under my direct supervision.

CONTRAST:  Isovue 300 approximately 65 mL.

FLUOROSCOPY TIME:  Fluoroscopy Time: 1 minutes 50 seconds 2238 mGy).

COMPLICATIONS:
None immediate.
The right groin was prepped and draped in the usual sterile fashion.
Thereafter using modified Seldinger technique, transfemoral access
into the right common femoral artery was obtained without
difficulty. Over a 0.035 inch guidewire, a 5 French Pinnacle sheath
was inserted. Through this, and also over 0.035 inch guidewire, a 5
French JB 1 catheter was advanced to the aortic arch region and
selectively positioned in the innominate artery, the left common
carotid artery and the left vertebral artery.
FINDINGS: The innominate artery injection demonstrates the origin of the right
subclavian artery to be widely patent. The right vertebral artery at
its origin demonstrates mild stenosis.

The vessel, otherwise, is seen to opacify normally to the cranial
skull base.

Wide patency is seen of the right vertebrobasilar junction. There is
a hypoplastic right posterior-inferior cerebellar artery.

The opacified portion of the right vertebrobasilar junction and the
proximal [DATE] of the basilar artery are widely patent.

Opacification of the anterior-inferior cerebellar arteries is noted
to be present.

Flow is noted into the distal basilar artery with mixing of blood
from the anterior circulation from the right posterior communicating
artery.

Inflow of non-opacified blood is also noted from the left vertebral
artery.

The right common carotid arteriogram demonstrates the right external
carotid artery and its major branches to be widely patent.

The right internal carotid artery at the bulb to the cranial skull
base opacifies normally.

The petrous, cavernous and the supraclinoid segments are widely
patent.

The right middle cerebral artery and the right anterior cerebral
artery opacify normally into the capillary and venous phases.

Focal areas of caliber irregularity are seen involving the inferior
division of the right middle cerebral artery in the M2 M3 region. A
right posterior communicating artery is seen opacifying the right
posterior cerebral artery distribution.

The left common carotid arteriogram demonstrates the left external
carotid artery and its major branches to be widely patent.

The left internal carotid artery at the bulb to the cranial skull
base opacifies normally.

The proximal petrous segment is widely patent.

There is again demonstrated a large fusiform aneurysm involving the
proximal cavernous segment which measures approximately 9 mm x
mm.

Distal to this the left internal carotid artery caval cavernous
segment and the distal cavernous segments demonstrate wide patency.

The left middle cerebral artery in its M1 segment demonstrates a 50%
stenosis. Mild caliber irregularity is noted of the left middle
cerebral artery superior division at its origin.

The left MCA distribution and the left anterior cerebral artery
distribution, otherwise, opacify into the capillary and venous
phases.

Cross-filling via the anterior communicating artery of the right
anterior cerebral artery A2 segment and distally is noted.

The left vertebral artery origin is widely patent.

The vessel is seen to opacify normally to the cranial skull base.
Wide patency is seen of the left vertebrobasilar junction and the
left posterior-inferior cerebellar artery.

The basilar artery, the superior cerebellar arteries and the
anterior-inferior cerebellar arteries opacify into the capillary and
venous phases.

Both posterior cerebral arteries are seen to opacify into the
capillary and venous phases.

There is approximately 50% stenosis involving the right posterior
cerebral artery P1 segment and to a lesser degree of the left
posterior cerebral artery P1 segment.
IMPRESSION: Large left internal carotid artery proximal cavernous segment
fusiform aneurysm with eccentric saccular component along the
inferior wall of the vessel. This measures approximately 9 mm x
mm compared to approximately 9 mm x 4.5 mm on the arteriogram of
01/02/2016.

50% stenosis of the left middle cerebral artery M1 segment.

Mild to moderate caliber irregularity involving the right middle
cerebral artery superior division, posterior cerebral arteries right
worse than left P1 segments, most likely related to intracranial
arteriosclerosis.

PLAN:
Angiographic findings were reviewed with the patient and the
patient's family.

Subsequent management of the left internal carotid artery proximal
cavernous sinus aneurysm showing enlargement will be discussed in an
outpatient consultation.

The patient was informed of this and was instructed to call to
schedule for an appointment in the next 2 weeks.

## 2019-07-13 DIAGNOSIS — Z1231 Encounter for screening mammogram for malignant neoplasm of breast: Secondary | ICD-10-CM | POA: Diagnosis not present

## 2019-07-20 DIAGNOSIS — Z79811 Long term (current) use of aromatase inhibitors: Secondary | ICD-10-CM | POA: Diagnosis not present

## 2019-07-20 DIAGNOSIS — C50312 Malignant neoplasm of lower-inner quadrant of left female breast: Secondary | ICD-10-CM | POA: Diagnosis not present

## 2019-07-20 DIAGNOSIS — Z170 Estrogen receptor positive status [ER+]: Secondary | ICD-10-CM | POA: Diagnosis not present

## 2019-07-20 DIAGNOSIS — Z853 Personal history of malignant neoplasm of breast: Secondary | ICD-10-CM | POA: Diagnosis not present

## 2019-07-20 DIAGNOSIS — M8589 Other specified disorders of bone density and structure, multiple sites: Secondary | ICD-10-CM | POA: Diagnosis not present

## 2019-07-20 DIAGNOSIS — Z79899 Other long term (current) drug therapy: Secondary | ICD-10-CM | POA: Diagnosis not present

## 2019-07-20 DIAGNOSIS — B372 Candidiasis of skin and nail: Secondary | ICD-10-CM | POA: Diagnosis not present

## 2019-07-29 DIAGNOSIS — H524 Presbyopia: Secondary | ICD-10-CM | POA: Diagnosis not present

## 2019-07-29 DIAGNOSIS — H40013 Open angle with borderline findings, low risk, bilateral: Secondary | ICD-10-CM | POA: Diagnosis not present

## 2019-07-29 DIAGNOSIS — H16223 Keratoconjunctivitis sicca, not specified as Sjogren's, bilateral: Secondary | ICD-10-CM | POA: Diagnosis not present

## 2020-02-06 DIAGNOSIS — J449 Chronic obstructive pulmonary disease, unspecified: Secondary | ICD-10-CM | POA: Diagnosis not present

## 2020-03-08 DIAGNOSIS — J449 Chronic obstructive pulmonary disease, unspecified: Secondary | ICD-10-CM | POA: Diagnosis not present

## 2020-03-15 ENCOUNTER — Other Ambulatory Visit: Payer: Self-pay | Admitting: Oncology

## 2020-03-18 ENCOUNTER — Encounter: Payer: Self-pay | Admitting: Oncology

## 2020-03-18 DIAGNOSIS — M8580 Other specified disorders of bone density and structure, unspecified site: Secondary | ICD-10-CM | POA: Insufficient documentation

## 2020-04-07 DIAGNOSIS — J449 Chronic obstructive pulmonary disease, unspecified: Secondary | ICD-10-CM | POA: Diagnosis not present

## 2020-05-08 DIAGNOSIS — J449 Chronic obstructive pulmonary disease, unspecified: Secondary | ICD-10-CM | POA: Diagnosis not present

## 2020-06-08 DIAGNOSIS — J449 Chronic obstructive pulmonary disease, unspecified: Secondary | ICD-10-CM | POA: Diagnosis not present

## 2020-07-06 DIAGNOSIS — J449 Chronic obstructive pulmonary disease, unspecified: Secondary | ICD-10-CM | POA: Diagnosis not present

## 2020-07-13 DIAGNOSIS — Z1231 Encounter for screening mammogram for malignant neoplasm of breast: Secondary | ICD-10-CM | POA: Diagnosis not present

## 2020-08-06 DIAGNOSIS — J449 Chronic obstructive pulmonary disease, unspecified: Secondary | ICD-10-CM | POA: Diagnosis not present

## 2020-09-05 DIAGNOSIS — J449 Chronic obstructive pulmonary disease, unspecified: Secondary | ICD-10-CM | POA: Diagnosis not present

## 2020-10-06 DIAGNOSIS — J449 Chronic obstructive pulmonary disease, unspecified: Secondary | ICD-10-CM | POA: Diagnosis not present

## 2020-11-05 DIAGNOSIS — J449 Chronic obstructive pulmonary disease, unspecified: Secondary | ICD-10-CM | POA: Diagnosis not present

## 2022-11-15 ENCOUNTER — Telehealth: Payer: Self-pay | Admitting: Hematology and Oncology

## 2022-11-15 NOTE — Telephone Encounter (Signed)
Spoke with patient. She has not been seen in our office since March 2021 as she missed an appointment in September 2021. She is now living with her friend, Hoy Register, and asks to have her, as well as her son Yori Grindel, added to her chart as contacts and to share medical info. The patient states she has been seen regularly by her PCP, Dr. Bernette Mayers, and has continued anastrozole daily under his care. She states she is up to date on right screening mammo and bone density scan. She has been doing well these past 2 years while living with Darl Pikes. I offered to see her, but as she has been following with Dr. Bernette Mayers, she would like to continue with him.  I advised them anastrozole was started at the end of September 2019, so would continue through September for a total of 5 years. They understand that we are available if needed.

## 2022-11-15 NOTE — Telephone Encounter (Signed)
-----   Message from Nurse Dot Lanes E sent at 11/15/2022 11:03 AM EDT ----- Regarding: anastrazole Patient called wanting to know when she can stop her Anastrazole she believes her 5 years will be up in August. She voiced she hasn't been seen in a long time and primary care Dr Bernette Mayers @ [facility] in [facility] is taking care of her.Patient did not seem interested in a appointment here.
